# Patient Record
Sex: Female | Born: 1995
Health system: Southern US, Community
[De-identification: ages and names within clinical notes are randomized; demographics above are authoritative.]

## PROBLEM LIST (undated history)

## (undated) DIAGNOSIS — Z789 Other specified health status: Secondary | ICD-10-CM

## (undated) DIAGNOSIS — O139 Gestational [pregnancy-induced] hypertension without significant proteinuria, unspecified trimester: Secondary | ICD-10-CM

## (undated) HISTORY — PX: WISDOM TOOTH EXTRACTION: SHX21

## (undated) HISTORY — PX: NO PAST SURGERIES: SHX2092

---
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
## 2013-07-10 ENCOUNTER — Encounter (HOSPITAL_COMMUNITY): Payer: Self-pay | Admitting: Emergency Medicine

## 2013-07-10 ENCOUNTER — Emergency Department (HOSPITAL_COMMUNITY)
Admission: EM | Admit: 2013-07-10 | Discharge: 2013-07-10 | Disposition: A | Discharge status: Home / Self Care | Payer: Medicaid Other | Attending: Emergency Medicine | Admitting: Emergency Medicine

## 2013-07-10 DIAGNOSIS — R112 Nausea with vomiting, unspecified: Secondary | ICD-10-CM | POA: Insufficient documentation

## 2013-07-10 DIAGNOSIS — R197 Diarrhea, unspecified: Secondary | ICD-10-CM | POA: Insufficient documentation

## 2013-07-10 DIAGNOSIS — K529 Noninfective gastroenteritis and colitis, unspecified: Secondary | ICD-10-CM

## 2013-07-10 DIAGNOSIS — K5289 Other specified noninfective gastroenteritis and colitis: Secondary | ICD-10-CM | POA: Insufficient documentation

## 2013-07-10 DIAGNOSIS — Z79899 Other long term (current) drug therapy: Secondary | ICD-10-CM | POA: Insufficient documentation

## 2013-07-10 DIAGNOSIS — N390 Urinary tract infection, site not specified: Secondary | ICD-10-CM | POA: Insufficient documentation

## 2013-07-10 DIAGNOSIS — Z3202 Encounter for pregnancy test, result negative: Secondary | ICD-10-CM | POA: Insufficient documentation

## 2013-07-10 DIAGNOSIS — R6883 Chills (without fever): Secondary | ICD-10-CM | POA: Insufficient documentation

## 2013-07-10 LAB — URINALYSIS, ROUTINE W REFLEX MICROSCOPIC
Glucose, UA: NEGATIVE mg/dL
Ketones, ur: 40 mg/dL — AB
Protein, ur: NEGATIVE mg/dL
pH: 5 (ref 5.0–8.0)

## 2013-07-10 LAB — COMPREHENSIVE METABOLIC PANEL
ALT: 9 U/L (ref 0–35)
AST: 20 U/L (ref 0–37)
Albumin: 4.6 g/dL (ref 3.5–5.2)
Alkaline Phosphatase: 74 U/L (ref 39–117)
BUN: 12 mg/dL (ref 6–23)
Chloride: 96 mEq/L (ref 96–112)
Potassium: 4 mEq/L (ref 3.5–5.1)
Sodium: 132 mEq/L — ABNORMAL LOW (ref 135–145)
Total Bilirubin: 1 mg/dL (ref 0.3–1.2)
Total Protein: 8.7 g/dL — ABNORMAL HIGH (ref 6.0–8.3)

## 2013-07-10 LAB — CBC WITH DIFFERENTIAL/PLATELET
Basophils Absolute: 0 10*3/uL (ref 0.0–0.1)
Basophils Relative: 0 % (ref 0–1)
Hemoglobin: 16.3 g/dL — ABNORMAL HIGH (ref 12.0–15.0)
MCHC: 35.3 g/dL (ref 30.0–36.0)
Monocytes Relative: 3 % (ref 3–12)
Neutro Abs: 6.1 10*3/uL (ref 1.7–7.7)
Neutrophils Relative %: 88 % — ABNORMAL HIGH (ref 43–77)
Platelets: 237 10*3/uL (ref 150–400)

## 2013-07-10 LAB — POCT PREGNANCY, URINE: Preg Test, Ur: NEGATIVE

## 2013-07-10 LAB — LIPASE, BLOOD: Lipase: 17 U/L (ref 11–59)

## 2013-07-10 MED ORDER — CIPROFLOXACIN HCL 250 MG PO TABS: 250.0000 mg | ORAL_TABLET | Freq: Two times a day (BID) | ORAL | Status: DC

## 2013-07-10 MED ORDER — ONDANSETRON 4 MG PO TBDP: ORAL_TABLET | ORAL | Status: DC

## 2013-07-10 MED ORDER — DICYCLOMINE HCL 20 MG PO TABS: 20.0000 mg | ORAL_TABLET | Freq: Two times a day (BID) | ORAL | Status: DC

## 2013-07-10 MED ORDER — ONDANSETRON HCL 4 MG/2ML IJ SOLN
4.0000 mg | Freq: Once | INTRAMUSCULAR | Status: AC
  Administered 2013-07-10: 4 mg via INTRAVENOUS
  Filled 2013-07-10: qty 2

## 2013-07-10 MED ORDER — SODIUM CHLORIDE 0.9 % IV BOLUS (SEPSIS)
1000.0000 mL | Freq: Once | INTRAVENOUS | Status: AC
  Administered 2013-07-10: 1000 mL via INTRAVENOUS

## 2013-07-10 NOTE — ED Provider Notes (Signed)
CSN: [id number]     Arrival date & time 07/10/13  1846 History     First MD Initiated Contact with Patient 07/10/13 2011     Chief Complaint  Patient presents with  . Abdominal Pain  . Nausea   (Consider location/radiation/quality/duration/timing/severity/associated sxs/prior Treatment) HPI Pt p/w 2 days of abd cramping, vomiting and loose stools. Pt states abd pain is improved after BM or vomiting. No fever. +chills. No blood in vomit or stool. No previous abdominal surgeries. No sick contacts. No urinary or vaginal symptoms. Pt admit to using Ibuprofen and aspirin regularly.  History reviewed. No pertinent past medical history. Past Surgical History  Procedure Laterality Date  . Wisdom tooth extraction     No family history on file. History  Substance Use Topics  . Smoking status: Never Smoker   . Smokeless tobacco: Never Used  . Alcohol Use: No   OB History   Grav Para Term Preterm Abortions TAB SAB Ect Mult Living                 Review of Systems  Constitutional: Positive for chills. Negative for fever and fatigue.  HENT: Negative for neck pain.   Respiratory: Negative for cough and shortness of breath.   Cardiovascular: Negative for chest pain, palpitations and leg swelling.  Gastrointestinal: Positive for nausea, vomiting, abdominal pain and diarrhea. Negative for constipation, blood in stool and abdominal distention.  Genitourinary: Negative for dysuria, frequency, flank pain, vaginal bleeding, vaginal discharge and pelvic pain.  Musculoskeletal: Negative for myalgias and back pain.  Skin: Negative for rash and wound.  Neurological: Negative for dizziness, weakness, light-headedness, numbness and headaches.  All other systems reviewed and are negative.    Allergies  Review of patient's allergies indicates no known allergies.  Home Medications   Current Outpatient Rx  Name  Route  Sig  Dispense  Refill  . medroxyPROGESTERone (DEPO-PROVERA) 150 MG/ML  injection   Intramuscular   Inject 1 mL (150 mg total) into the muscle every 3 (three) months.   1 mL      . ciprofloxacin (CIPRO) 250 MG tablet   Oral   Take 1 tablet (250 mg total) by mouth every 12 (twelve) hours.   10 tablet   0   . dicyclomine (BENTYL) 20 MG tablet   Oral   Take 1 tablet (20 mg total) by mouth 2 (two) times daily.   20 tablet   0   . ondansetron (ZOFRAN ODT) 4 MG disintegrating tablet      4mg  ODT q4 hours prn nausea/vomit   8 tablet   0    BP 137/80  Pulse 93  Temp(Src) 99.7 F (37.6 C) (Oral)  Resp 18  SpO2 100%  LMP 06/09/2013 Physical Exam  Nursing note and vitals reviewed. Constitutional: She is oriented to person, place, and time. She appears well-developed and well-nourished. No distress.  HENT:  Head: Normocephalic and atraumatic.  Mouth/Throat: Oropharynx is clear and moist. No oropharyngeal exudate.  Eyes: EOM are normal. Pupils are equal, round, and reactive to light.  Neck: Normal range of motion. Neck supple.  Cardiovascular: Normal rate and regular rhythm.   Pulmonary/Chest: Effort normal and breath sounds normal. No respiratory distress. She has no wheezes. She has no rales. She exhibits no tenderness.  Abdominal: Soft. Bowel sounds are normal. She exhibits no distension and no mass. There is tenderness (epigastric TTP. No rebound or guarding. ). There is no rebound and no guarding.  Musculoskeletal: Normal range of  motion. She exhibits no edema and no tenderness.  No CVAT  Neurological: She is alert and oriented to person, place, and time.  5/5 motor in all ext, sensation intact.   Skin: Skin is warm and dry. No rash noted. No erythema.  Psychiatric: She has a normal mood and affect. Her behavior is normal.    ED Course   Procedures (including critical care time)  Labs Reviewed  CBC WITH DIFFERENTIAL - Abnormal; Notable for the following:    RBC 5.49 (*)    Hemoglobin 16.3 (*)    HCT 46.2 (*)    Neutrophils Relative %  88 (*)    Lymphocytes Relative 9 (*)    Lymphs Abs 0.6 (*)    All other components within normal limits  COMPREHENSIVE METABOLIC PANEL - Abnormal; Notable for the following:    Sodium 132 (*)    Total Protein 8.7 (*)    GFR calc non Af Amer 89 (*)    All other components within normal limits  URINALYSIS, ROUTINE W REFLEX MICROSCOPIC - Abnormal; Notable for the following:    Color, Urine AMBER (*)    APPearance CLOUDY (*)    Specific Gravity, Urine 1.035 (*)    Hgb urine dipstick TRACE (*)    Bilirubin Urine SMALL (*)    Ketones, ur 40 (*)    Leukocytes, UA MODERATE (*)    All other components within normal limits  LIPASE, BLOOD  URINE MICROSCOPIC-ADD ON  POCT PREGNANCY, URINE   No results found. 1. Gastroenteritis   2. UTI (urinary tract infection)     MDM  Pt with no vomiting in ED. States she feels better. Had one episode of diarrhea in ED. Return precautions given. Abd remain soft.   Loren Racer, MD 07/10/13 [phone]

## 2013-07-10 NOTE — ED Notes (Addendum)
Pt reports 9/10 mid abdominal pain. Pt reports that she has had nausea, vomiting, and diarrhea since yesterday. Pt denies any other family members or friends being sick. Pt is A/Ox4, vital signs are stable, and in no apparent distress.

## 2013-10-08 ENCOUNTER — Encounter (HOSPITAL_COMMUNITY): Payer: Self-pay | Admitting: Emergency Medicine

## 2013-10-08 ENCOUNTER — Emergency Department (HOSPITAL_COMMUNITY)
Admission: EM | Admit: 2013-10-08 | Discharge: 2013-10-08 | Disposition: A | Discharge status: Home / Self Care | Payer: Medicaid Other | Attending: Emergency Medicine | Admitting: Emergency Medicine

## 2013-10-08 DIAGNOSIS — T24219A Burn of second degree of unspecified thigh, initial encounter: Secondary | ICD-10-CM | POA: Insufficient documentation

## 2013-10-08 DIAGNOSIS — T24112A Burn of first degree of left thigh, initial encounter: Secondary | ICD-10-CM

## 2013-10-08 DIAGNOSIS — T24212A Burn of second degree of left thigh, initial encounter: Secondary | ICD-10-CM

## 2013-10-08 DIAGNOSIS — X12XXXA Contact with other hot fluids, initial encounter: Secondary | ICD-10-CM | POA: Insufficient documentation

## 2013-10-08 DIAGNOSIS — Y929 Unspecified place or not applicable: Secondary | ICD-10-CM | POA: Insufficient documentation

## 2013-10-08 DIAGNOSIS — T2620XA Burn with resulting rupture and destruction of unspecified eyeball, initial encounter: Secondary | ICD-10-CM | POA: Insufficient documentation

## 2013-10-08 DIAGNOSIS — Y939 Activity, unspecified: Secondary | ICD-10-CM | POA: Insufficient documentation

## 2013-10-08 MED ORDER — SILVER SULFADIAZINE 1 % EX CREA
TOPICAL_CREAM | Freq: Once | CUTANEOUS | Status: AC
  Administered 2013-10-08: 16:00:00 via TOPICAL
  Filled 2013-10-08: qty 85

## 2013-10-08 MED ORDER — SILVER SULFADIAZINE 1 % EX CREA: 1.0000 "application " | TOPICAL_CREAM | Freq: Every day | CUTANEOUS | Status: DC

## 2013-10-08 MED ORDER — TRAMADOL HCL 50 MG PO TABS: 50.0000 mg | ORAL_TABLET | Freq: Four times a day (QID) | ORAL | Status: DC | PRN
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
## 2013-10-14 ENCOUNTER — Telehealth: Payer: Self-pay | Admitting: Family Medicine

## 2013-10-14 NOTE — Telephone Encounter (Signed)
Phoned pt lmtrc needs follow up er appt

## 2013-10-15 NOTE — ED Provider Notes (Signed)
Medical screening examination/treatment/procedure(s) were performed by non-physician practitioner and as supervising physician I was immediately available for consultation/collaboration.  EKG Interpretation   None        Derwood Kaplan, MD 10/15/13 0321

## 2013-10-15 NOTE — ED Provider Notes (Signed)
Medical screening examination/treatment/procedure(s) were performed by non-physician practitioner and as supervising physician I was immediately available for consultation/collaboration.  EKG Interpretation   None        Derwood Kaplan, MD 10/15/13 [phone]

## 2013-10-27 NOTE — ED Provider Notes (Signed)
Medical screening examination/treatment/procedure(s) were performed by non-physician practitioner and as supervising physician I was immediately available for consultation/collaboration.  EKG Interpretation   None        Carmine Carrozza, MD 10/27/13 2219 

## 2019-06-28 ENCOUNTER — Emergency Department (HOSPITAL_COMMUNITY)
Admission: EM | Admit: 2019-06-28 | Discharge: 2019-06-28 | Disposition: A | Discharge status: Home / Self Care | Payer: Self-pay | Attending: Emergency Medicine | Admitting: Emergency Medicine

## 2019-06-28 ENCOUNTER — Other Ambulatory Visit: Payer: Self-pay

## 2019-06-28 ENCOUNTER — Encounter (HOSPITAL_COMMUNITY): Payer: Self-pay | Admitting: Emergency Medicine

## 2019-06-28 DIAGNOSIS — N390 Urinary tract infection, site not specified: Secondary | ICD-10-CM

## 2019-06-28 DIAGNOSIS — R1084 Generalized abdominal pain: Secondary | ICD-10-CM | POA: Insufficient documentation

## 2019-06-28 DIAGNOSIS — B9689 Other specified bacterial agents as the cause of diseases classified elsewhere: Secondary | ICD-10-CM

## 2019-06-28 DIAGNOSIS — N760 Acute vaginitis: Secondary | ICD-10-CM

## 2019-06-28 LAB — WET PREP, GENITAL
Trich, Wet Prep: NONE SEEN
Yeast Wet Prep HPF POC: NONE SEEN

## 2019-06-28 LAB — URINALYSIS, ROUTINE W REFLEX MICROSCOPIC
Bilirubin Urine: NEGATIVE
Glucose, UA: NEGATIVE mg/dL
Ketones, ur: NEGATIVE mg/dL
Nitrite: NEGATIVE
Protein, ur: 30 mg/dL — AB
Specific Gravity, Urine: 1.014 (ref 1.005–1.030)
pH: 6 (ref 5.0–8.0)

## 2019-06-28 LAB — POC URINE PREG, ED: Preg Test, Ur: NEGATIVE

## 2019-06-28 MED ORDER — CEPHALEXIN 250 MG PO CAPS
500.0000 mg | ORAL_CAPSULE | Freq: Once | ORAL | Status: AC
  Administered 2019-06-28: 500 mg via ORAL
  Filled 2019-06-28: qty 2

## 2019-06-28 MED ORDER — METRONIDAZOLE 500 MG PO TABS
500.0000 mg | ORAL_TABLET | Freq: Once | ORAL | Status: AC
  Administered 2019-06-28: 500 mg via ORAL
  Filled 2019-06-28: qty 1

## 2019-06-28 MED ORDER — CEPHALEXIN 500 MG PO CAPS: 500.0000 mg | ORAL_CAPSULE | Freq: Three times a day (TID) | ORAL | 0 refills | Status: DC

## 2019-06-28 MED ORDER — IBUPROFEN 800 MG PO TABS
800.0000 mg | ORAL_TABLET | Freq: Once | ORAL | Status: AC
  Administered 2019-06-28: 800 mg via ORAL
  Filled 2019-06-28: qty 1

## 2019-06-28 MED ORDER — METRONIDAZOLE 500 MG PO TABS: 500.0000 mg | ORAL_TABLET | Freq: Two times a day (BID) | ORAL | 0 refills | Status: DC

## 2019-06-28 NOTE — ED Notes (Signed)
Discharge instructions discussed with pt. Pt. verbalized understanding. No questions at this time 

## 2019-06-28 NOTE — ED Provider Notes (Signed)
TIME SEEN: 5:19 AM  CHIEF COMPLAINT: Abdominal pain  HPI: Patient is a 23 year old female with no significant past medical history who presents to the emergency department with lower abdominal pain that she describes as a cramping for the past 2 weeks.  Last menstrual period was July 9.  She is sexually active with one female partner, her husband.  States she has had chlamydia and gonorrhea in the past that have been treated.  No previous pregnancies.  She denies fevers, nausea, vomiting, vaginal discharge or bleeding.  Has had some dysuria.  States she was concerned this could be an STD or UTI.  ROS: See HPI Constitutional: no fever  Eyes: no drainage  ENT: no runny nose   Cardiovascular:  no chest pain  Resp: no SOB  GI: no vomiting GU:  dysuria Integumentary: no rash  Allergy: no hives  Musculoskeletal: no leg swelling  Neurological: no slurred speech ROS otherwise negative  PAST MEDICAL HISTORY/PAST SURGICAL HISTORY:  History reviewed. No pertinent past medical history.  MEDICATIONS:  Prior to Admission medications   Medication Sig Start Date End Date Taking? Authorizing Provider  diphenhydrAMINE (BENADRYL) 25 mg capsule Take 25 mg by mouth every 6 (six) hours as needed for allergies.    [provider]  medroxyPROGESTERone (DEPO-PROVERA) 150 MG/ML injection Inject 1 mL (150 mg total) into the muscle every 3 (three) months. 09/09/11   Denita Lung, MD  silver sulfADIAZINE (SILVADENE) 1 % cream Apply 1 application topically daily. 10/08/13   Kirichenko, Lahoma Rocker, PA-C  traMADol (ULTRAM) 50 MG tablet Take 1 tablet (50 mg total) by mouth every 6 (six) hours as needed. 10/08/13   Kirichenko, Lahoma Rocker, PA-C    ALLERGIES:  No Known Allergies  SOCIAL HISTORY:  Social History   Tobacco Use  . Smoking status: Never Smoker  . Smokeless tobacco: Never Used  Substance Use Topics  . Alcohol use: No    FAMILY HISTORY: History reviewed. No pertinent family  history.  EXAM: BP 116/74   Pulse 86   Temp 97.8 F (36.6 C) (Oral)   Resp 19   Ht 5\' 2"  (1.575 m)   Wt 54.4 kg   LMP 06/13/2019   SpO2 99%   BMI 21.95 kg/m  CONSTITUTIONAL: Alert and oriented and responds appropriately to questions. Well-appearing; well-nourished HEAD: Normocephalic EYES: Conjunctivae clear, pupils appear equal, EOMI ENT: normal nose; moist mucous membranes NECK: Supple, no meningismus, no nuchal rigidity, no LAD  CARD: RRR; S1 and S2 appreciated; no murmurs, no clicks, no rubs, no gallops RESP: Normal chest excursion without splinting or tachypnea; breath sounds clear and equal bilaterally; no wheezes, no rhonchi, no rales, no hypoxia or respiratory distress, speaking full sentences ABD/GI: Normal bowel sounds; non-distended; soft, non-tender, no rebound, no guarding, no peritoneal signs, no hepatosplenomegaly GU:  Normal external genitalia. No lesions, rashes noted. Patient has no vaginal bleeding on exam.  Minimal thick white vaginal discharge.  No adnexal tenderness, mass or fullness, no cervical motion tenderness. Cervix is not appear friable.  Cervix is closed.  Chaperone present for exam.  Patient has some suprapubic tenderness on bimanual exam. BACK:  The back appears normal and is non-tender to palpation, there is no CVA tenderness EXT: Normal ROM in all joints; non-tender to palpation; no edema; normal capillary refill; no cyanosis, no calf tenderness or swelling    SKIN: Normal color for age and race; warm; no rash NEURO: Moves all extremities equally PSYCH: The patient's mood and manner are appropriate. Grooming and  personal hygiene are appropriate.  MEDICAL DECISION MAKING: Patient here with complaints of lower abdominal pain.  Her pelvic exam is unremarkable other than some suprapubic tenderness on bimanual exam.  No tenderness at McBurney's point.  No bleeding or significant discharge.  Will obtain urinalysis, urine pregnancy test and pelvic cultures.   Low suspicion for torsion, TOA, PID based on exam.  Doubt appendicitis.  Will give ibuprofen for symptomatic relief.  Doubt pyelonephritis or kidney stone.  She is well-appearing without flank pain, vomiting.  ED PROGRESS: Patient's urine appears infected.  She has large leukocyte esterase, 6-10 red blood cells, 11-20 white blood cells and rare bacteria but only 0-5 squamous cells.  Urine culture pending.  Will discharge on Keflex.  Her wet prep is also positive for clue cells.  Given she does have some discharge on exam, will discharge with Flagyl as well.  Discussed things findings with patient.  Her pregnancy test is negative.  Recommended Tylenol, Motrin and Azo over-the-counter for pain.  She verbalized understanding.  She has a PCP for follow-up as needed.  At this time, I do not feel there is any life-threatening condition present. I have reviewed and discussed all results (EKG, imaging, lab, urine as appropriate) and exam findings with patient/family. I have reviewed nursing notes and appropriate previous records.  I feel the patient is safe to be discharged home without further emergent workup and can continue workup as an outpatient as needed. Discussed usual and customary return precautions. Patient/family verbalize understanding and are comfortable with this plan.  Outpatient follow-up has been provided as needed. All questions have been answered.      Correen Bubolz, Layla MawKristen N, DO 06/28/19 [phone]

## 2019-06-28 NOTE — ED Triage Notes (Signed)
Pt from home c/o abdominal cramping of lower abdomen. States pain 7 out of 10. Starting last week. Cramping consistent. Pt denies NVD.

## 2019-06-28 NOTE — Discharge Instructions (Addendum)
You may alternate Tylenol 1000 mg every 6 hours as needed for pain and Ibuprofen 800 mg every 8 hours as needed for pain.  Please take Ibuprofen with food. ° °

## 2019-06-29 LAB — GC/CHLAMYDIA PROBE AMP (~~LOC~~) NOT AT ARMC
Chlamydia: POSITIVE — AB
Neisseria Gonorrhea: NEGATIVE

## 2019-06-29 LAB — URINE CULTURE: Culture: 80000 — AB

## 2019-06-30 ENCOUNTER — Telehealth: Payer: Self-pay | Admitting: *Deleted

## 2019-06-30 NOTE — Telephone Encounter (Signed)
Post ED Visit - Positive Culture Follow-up  Culture report reviewed by antimicrobial stewardship pharmacist: [place] Team []  Elenor Quinones, Pharm.D. []  Heide Guile, Pharm.D., BCPS AQ-ID []  Parks Neptune, Pharm.D., BCPS []  Alycia Rossetti, Pharm.D., BCPS []  [place], Pharm.D., BCPS, AAHIVP []  Legrand Como, Pharm.D., BCPS, AAHIVP [x]  Salome Arnt, PharmD, BCPS []  Johnnette Gourd, PharmD, BCPS []  Hughes Better, PharmD, BCPS []  Leeroy Cha, PharmD []  Laqueta Linden, PharmD, BCPS []  Albertina Parr, PharmD  [place] Team []  Leodis Sias, PharmD []  Lindell Spar, PharmD []  Royetta Asal, PharmD []  Graylin Shiver, Rph []  Rema Fendt) Glennon Mac, PharmD []  Arlyn Dunning, PharmD []  Netta Cedars, PharmD []  Dia Sitter, PharmD []  Leone Haven, PharmD []  Gretta Arab, PharmD []  Theodis Shove, PharmD []  Peggyann Juba, PharmD []  Reuel Boom, PharmD   Positive urine culture Treated with Cephalexin, organism sensitive to the same and no further patient follow-up is required at this time.  Harlon Flor [facility] 06/30/2019, 2:53 PM

## 2019-07-03 ENCOUNTER — Telehealth: Payer: Self-pay | Admitting: Student

## 2019-07-03 DIAGNOSIS — A749 Chlamydial infection, unspecified: Secondary | ICD-10-CM

## 2019-07-03 MED ORDER — AZITHROMYCIN 500 MG PO TABS: 1000.0000 mg | ORAL_TABLET | Freq: Once | ORAL | 0 refills | Status: AC

## 2019-07-03 NOTE — Telephone Encounter (Addendum)
Darice D Bezold tested positive for  Chlamydia. Patient was called by RN and allergies and pharmacy confirmed. Rx sent to pharmacy of choice.   Jorje Guild, NP 07/03/2019 10:12 AM       ----- Message from Bjorn Loser, RN sent at 07/02/2019  2:39 PM EDT ----- This patient tested positive for :  Chlamydia  She:"has NKDA", I have informed the patient of her results and confirmed her pharmacy is correct in her chart. Please send Rx.   Thank you,   Bjorn Loser, RN   Results faxed to [facility].

## 2020-02-10 ENCOUNTER — Encounter (HOSPITAL_COMMUNITY): Payer: Self-pay | Admitting: *Deleted

## 2020-02-10 ENCOUNTER — Ambulatory Visit (HOSPITAL_COMMUNITY)
Admission: EM | Admit: 2020-02-10 | Discharge: 2020-02-10 | Disposition: A | Discharge status: Home / Self Care | Payer: Medicaid Other | Attending: Family Medicine | Admitting: Family Medicine

## 2020-02-10 ENCOUNTER — Other Ambulatory Visit: Payer: Self-pay

## 2020-02-10 DIAGNOSIS — R350 Frequency of micturition: Secondary | ICD-10-CM | POA: Insufficient documentation

## 2020-02-10 DIAGNOSIS — Z3201 Encounter for pregnancy test, result positive: Secondary | ICD-10-CM | POA: Insufficient documentation

## 2020-02-10 DIAGNOSIS — R109 Unspecified abdominal pain: Secondary | ICD-10-CM

## 2020-02-10 LAB — POCT URINALYSIS DIP (DEVICE)
Bilirubin Urine: NEGATIVE
Glucose, UA: NEGATIVE mg/dL
Ketones, ur: NEGATIVE mg/dL
Nitrite: NEGATIVE
Protein, ur: 100 mg/dL — AB
Specific Gravity, Urine: 1.025 (ref 1.005–1.030)
Urobilinogen, UA: 0.2 mg/dL (ref 0.0–1.0)
pH: 5.5 (ref 5.0–8.0)

## 2020-02-10 LAB — POC URINE PREG, ED: Preg Test, Ur: POSITIVE — AB

## 2020-02-10 LAB — POCT PREGNANCY, URINE: Preg Test, Ur: POSITIVE — AB

## 2020-02-10 MED ORDER — CEPHALEXIN 500 MG PO CAPS: 500.0000 mg | ORAL_CAPSULE | Freq: Four times a day (QID) | ORAL | 0 refills | Status: DC

## 2020-02-10 NOTE — Discharge Instructions (Signed)
Your pregnancy test was positive. We are treating you for urinary tract infection. If your abdominal pain worsens despite this treatment you will need to go to the ER for ultrasound. Given you a contact for primary care, OB/GYN follow-up.

## 2020-02-10 NOTE — ED Triage Notes (Signed)
Patient reports lower abdominal pain since March 1st, described as cramping and sharp, pain is intermittent, pain has been constant no progression of severity. BM hx normal. Patient reports polyuria. Denies flank pain.

## 2020-02-10 NOTE — ED Provider Notes (Signed)
[place]    CSN: [id number] Arrival date & time: 02/10/20  5462      History   Chief Complaint Chief Complaint  Patient presents with  . Abdominal Pain    HPI Amanda Mcdonald is a 24 y.o. female.   Patient is a 24 year old female presents today with lower abdominal cramping that has been intermittent since 02/03/2020.  Described as cramping and sharp at times.  It is not gotten worse.  She is also had frequent urination.  Denies any fever, nausea, vomiting.  Reporting regular bowel movements and no diarrhea.  Last menstrual period was 12/24/2019.  There is some concern for pregnancy.  Denies any vaginal discharge, bleeding or irritation.  ROS per HPI      History reviewed. No pertinent past medical history.  There are no problems to display for this patient.   Past Surgical History:  Procedure Laterality Date  . WISDOM TOOTH EXTRACTION      OB History   No obstetric history on file.      Home Medications    Prior to Admission medications   Medication Sig Start Date End Date Taking? Authorizing Provider  cephALEXin (KEFLEX) 500 MG capsule Take 1 capsule (500 mg total) by mouth 4 (four) times daily. 02/10/20   Orvan July, NP    Family History Family History  Problem Relation Age of Onset  . Healthy Mother   . Healthy Father     Social History Social History   Tobacco Use  . Smoking status: Never Smoker  . Smokeless tobacco: Never Used  Substance Use Topics  . Alcohol use: No  . Drug use: No     Allergies   Patient has no known allergies.   Review of Systems Review of Systems   Physical Exam Triage Vital Signs ED Triage Vitals  Enc Vitals Group     BP 02/10/20 0924 124/86     Pulse Rate 02/10/20 0924 80     Resp 02/10/20 0924 16     Temp 02/10/20 0924 98.9 F (37.2 C)     Temp Source 02/10/20 0924 Oral     SpO2 02/10/20 0924 99 %     Weight --      Height --      Head Circumference --      Peak Flow --      Pain Score  02/10/20 0922 6     Pain Loc --      Pain Edu? --      Excl. in [place]? --    No data found.  Updated Vital Signs BP 124/86 (BP Location: Left Arm)   Pulse 80   Temp 98.9 F (37.2 C) (Oral)   Resp 16   LMP 12/24/2019   SpO2 99%   Visual Acuity Right Eye Distance:   Left Eye Distance:   Bilateral Distance:    Right Eye Near:   Left Eye Near:    Bilateral Near:     Physical Exam Vitals and nursing note reviewed.  Constitutional:      General: She is not in acute distress.    Appearance: Normal appearance. She is not ill-appearing, toxic-appearing or diaphoretic.  HENT:     Head: Normocephalic.     Nose: Nose normal.  Eyes:     Conjunctiva/sclera: Conjunctivae normal.  Pulmonary:     Effort: Pulmonary effort is normal.  Abdominal:     Palpations: Abdomen is soft.     Tenderness: There is  no abdominal tenderness.  Musculoskeletal:        General: Normal range of motion.     Cervical back: Normal range of motion.  Skin:    General: Skin is warm and dry.     Findings: No rash.  Neurological:     Mental Status: She is alert.  Psychiatric:        Mood and Affect: Mood normal.      UC Treatments / Results  Labs (all labs ordered are listed, but only abnormal results are displayed) Labs Reviewed  POCT URINALYSIS DIP (DEVICE) - Abnormal; Notable for the following components:      Result Value   Hgb urine dipstick LARGE (*)    Protein, ur 100 (*)    Leukocytes,Ua SMALL (*)    All other components within normal limits  POCT PREGNANCY, URINE - Abnormal; Notable for the following components:   Preg Test, Ur POSITIVE (*)    All other components within normal limits  POC URINE PREG, ED - Abnormal; Notable for the following components:   Preg Test, Ur POSITIVE (*)    All other components within normal limits  URINE CULTURE    EKG   Radiology No results found.  Procedures Procedures (including critical care time)  Medications Ordered in UC Medications - No  data to display  Initial Impression / Assessment and Plan / UC Course  I have reviewed the triage vital signs and the nursing notes.  Pertinent labs & imaging results that were available during my care of the patient were reviewed by me and considered in my medical decision making (see chart for details).     Pregnancy test positive-contact given for OB/GYN follow-up.  Recommended starting prenatal vitamins.  Frequent urination-urine with small leuks and large hemoglobin. Most likely patient's frequent urination and abdominal discomfort is coming from a urinary tract infection. Will treat with antibiotics and have her push fluids. Recommended if the pain continues she need to follow-up with OB/GYN or [facility]. Patient understanding and agree. Final Clinical Impressions(s) / UC Diagnoses   Final diagnoses:  Positive pregnancy test  Frequent urination     Discharge Instructions     Your pregnancy test was positive. We are treating you for urinary tract infection. If your abdominal pain worsens despite this treatment you will need to go to the ER for ultrasound. Given you a contact for primary care, OB/GYN follow-up.    ED Prescriptions    Medication Sig Dispense Auth. Provider   cephALEXin (KEFLEX) 500 MG capsule Take 1 capsule (500 mg total) by mouth 4 (four) times daily. 28 capsule Jann Ra A, NP     PDMP not reviewed this encounter.   Janace Aris, NP 02/10/20 1226

## 2020-02-12 LAB — URINE CULTURE: Culture: >=100000 — AB

## 2020-03-23 LAB — OB RESULTS CONSOLE GC/CHLAMYDIA
Chlamydia: NEGATIVE
Gonorrhea: NEGATIVE

## 2020-03-23 LAB — OB RESULTS CONSOLE ABO/RH: RH Type: POSITIVE

## 2020-03-23 LAB — OB RESULTS CONSOLE HEPATITIS B SURFACE ANTIGEN: Hepatitis B Surface Ag: NEGATIVE

## 2020-03-23 LAB — OB RESULTS CONSOLE HIV ANTIBODY (ROUTINE TESTING): HIV: NONREACTIVE

## 2020-03-23 LAB — OB RESULTS CONSOLE RPR: RPR: NONREACTIVE

## 2020-03-23 LAB — OB RESULTS CONSOLE RUBELLA ANTIBODY, IGM: Rubella: IMMUNE

## 2020-03-26 ENCOUNTER — Inpatient Hospital Stay (HOSPITAL_COMMUNITY): Payer: Medicaid Other

## 2020-03-26 ENCOUNTER — Encounter (HOSPITAL_COMMUNITY): Payer: Self-pay | Admitting: Family Medicine

## 2020-03-26 ENCOUNTER — Inpatient Hospital Stay (HOSPITAL_COMMUNITY)
Admission: AD | Admit: 2020-03-26 | Discharge: 2020-03-26 | Disposition: A | Discharge status: Home / Self Care | Payer: Medicaid Other | Attending: Family Medicine | Admitting: Family Medicine

## 2020-03-26 ENCOUNTER — Other Ambulatory Visit: Payer: Self-pay

## 2020-03-26 DIAGNOSIS — Z3490 Encounter for supervision of normal pregnancy, unspecified, unspecified trimester: Secondary | ICD-10-CM

## 2020-03-26 DIAGNOSIS — O210 Mild hyperemesis gravidarum: Secondary | ICD-10-CM | POA: Insufficient documentation

## 2020-03-26 DIAGNOSIS — Z3A11 11 weeks gestation of pregnancy: Secondary | ICD-10-CM

## 2020-03-26 DIAGNOSIS — O219 Vomiting of pregnancy, unspecified: Secondary | ICD-10-CM

## 2020-03-26 HISTORY — DX: Other specified health status: Z78.9

## 2020-03-26 LAB — URINALYSIS, ROUTINE W REFLEX MICROSCOPIC
Bilirubin Urine: NEGATIVE
Glucose, UA: NEGATIVE mg/dL
Hgb urine dipstick: NEGATIVE
Ketones, ur: NEGATIVE mg/dL
Leukocytes,Ua: NEGATIVE
Nitrite: NEGATIVE
Protein, ur: NEGATIVE mg/dL
Specific Gravity, Urine: 1.023 (ref 1.005–1.030)
pH: 6 (ref 5.0–8.0)

## 2020-03-26 LAB — CBC WITH DIFFERENTIAL/PLATELET
Abs Immature Granulocytes: 0.02 10*3/uL (ref 0.00–0.07)
Basophils Absolute: 0 10*3/uL (ref 0.0–0.1)
Basophils Relative: 0 %
Eosinophils Absolute: 0 10*3/uL (ref 0.0–0.5)
Eosinophils Relative: 0 %
HCT: 34 % — ABNORMAL LOW (ref 36.0–46.0)
Hemoglobin: 11.7 g/dL — ABNORMAL LOW (ref 12.0–15.0)
Immature Granulocytes: 0 %
Lymphocytes Relative: 25 %
Lymphs Abs: 1.3 10*3/uL (ref 0.7–4.0)
MCH: 30.2 pg (ref 26.0–34.0)
MCHC: 34.4 g/dL (ref 30.0–36.0)
MCV: 87.9 fL (ref 80.0–100.0)
Monocytes Absolute: 0.4 10*3/uL (ref 0.1–1.0)
Monocytes Relative: 7 %
Neutro Abs: 3.6 10*3/uL (ref 1.7–7.7)
Neutrophils Relative %: 68 %
Platelets: 157 10*3/uL (ref 150–400)
RBC: 3.87 MIL/uL (ref 3.87–5.11)
RDW: 12.3 % (ref 11.5–15.5)
WBC: 5.3 10*3/uL (ref 4.0–10.5)
nRBC: 0 % (ref 0.0–0.2)

## 2020-03-26 LAB — COMPREHENSIVE METABOLIC PANEL
ALT: 10 U/L (ref 0–44)
AST: 14 U/L — ABNORMAL LOW (ref 15–41)
Albumin: 3.3 g/dL — ABNORMAL LOW (ref 3.5–5.0)
Alkaline Phosphatase: 32 U/L — ABNORMAL LOW (ref 38–126)
Anion gap: 9 (ref 5–15)
BUN: <5 mg/dL — ABNORMAL LOW (ref 6–20)
CO2: 23 mmol/L (ref 22–32)
Calcium: 9 mg/dL (ref 8.9–10.3)
Chloride: 101 mmol/L (ref 98–111)
Creatinine, Ser: 0.75 mg/dL (ref 0.44–1.00)
GFR calc Af Amer: >60 mL/min (ref >60)
GFR calc non Af Amer: >60 mL/min (ref >60)
Glucose, Bld: 93 mg/dL (ref 70–99)
Potassium: 3.7 mmol/L (ref 3.5–5.1)
Sodium: 133 mmol/L — ABNORMAL LOW (ref 135–145)
Total Bilirubin: 0.7 mg/dL (ref 0.3–1.2)
Total Protein: 6.4 g/dL — ABNORMAL LOW (ref 6.5–8.1)

## 2020-03-26 MED ORDER — FAMOTIDINE 20 MG PO TABS: 20.0000 mg | ORAL_TABLET | Freq: Every day | ORAL | 0 refills | Status: DC

## 2020-03-26 MED ORDER — LACTATED RINGERS IV BOLUS
1000.0000 mL | Freq: Once | INTRAVENOUS | Status: AC
  Administered 2020-03-26: 1000 mL via INTRAVENOUS

## 2020-03-26 MED ORDER — ONDANSETRON 8 MG PO TBDP: 8.0000 mg | ORAL_TABLET | Freq: Three times a day (TID) | ORAL | 1 refills | Status: AC | PRN

## 2020-03-26 MED ORDER — FAMOTIDINE IN NACL 20-0.9 MG/50ML-% IV SOLN
20.0000 mg | Freq: Once | INTRAVENOUS | Status: AC
  Administered 2020-03-26: 20 mg via INTRAVENOUS
  Filled 2020-03-26: qty 50

## 2020-03-26 MED ORDER — SODIUM CHLORIDE 0.9 % IV SOLN
8.0000 mg | Freq: Once | INTRAVENOUS | Status: AC
  Administered 2020-03-26: 8 mg via INTRAVENOUS
  Filled 2020-03-26: qty 4

## 2020-03-26 NOTE — MAU Note (Signed)
Pt c/o of nausea and vomiting throughout pregnancy which got worse last night. Denies LOF or VB, vaginal discharge or cramping.

## 2020-03-26 NOTE — Discharge Instructions (Signed)
Safe Medications in Pregnancy    Acne: Benzoyl Peroxide Salicylic Acid  Backache/Headache: Tylenol: 2 regular strength every 4 hours OR              2 Extra strength every 6 hours  Colds/Coughs/Allergies: Benadryl (alcohol free) 25 mg every 6 hours as needed Breath right strips Claritin Cepacol throat lozenges Chloraseptic throat spray Cold-Eeze- up to three times per day Cough drops, alcohol free Flonase (by prescription only) Guaifenesin Mucinex Robitussin DM (plain only, alcohol free) Saline nasal spray/drops Sudafed (pseudoephedrine) & Actifed ** use only after [redacted] weeks gestation and if you do not have high blood pressure Tylenol Vicks Vaporub Zinc lozenges Zyrtec   Constipation: Colace Ducolax suppositories Fleet enema Glycerin suppositories Metamucil Milk of magnesia Miralax Senokot Smooth move tea  Diarrhea: Kaopectate Imodium A-D  *NO pepto Bismol  Hemorrhoids: Anusol Anusol HC Preparation H Tucks  Indigestion: Tums Maalox Mylanta Zantac  Pepcid  Insomnia: Benadryl (alcohol free) 25mg  every 6 hours as needed Tylenol PM Unisom, no Gelcaps  Leg Cramps: Tums MagGel  Nausea/Vomiting:  Bonine Dramamine Emetrol Ginger extract Sea bands Meclizine  Nausea medication to take during pregnancy:  Unisom (doxylamine succinate 25 mg tablets) Take one tablet daily at bedtime. If symptoms are not adequately controlled, the dose can be increased to a maximum recommended dose of two tablets daily (1/2 tablet in the morning, 1/2 tablet mid-afternoon and one at bedtime). Vitamin B6 100mg  tablets. Take one tablet twice a day (up to 200 mg per day).  Skin Rashes: Aveeno products Benadryl cream or 25mg  every 6 hours as needed Calamine Lotion 1% cortisone cream  Yeast infection: Gyne-lotrimin 7 Monistat 7   **If taking multiple medications, please check labels to avoid duplicating the same active ingredients **take  medication as directed on the label ** Do not exceed 4000 mg of tylenol in 24 hours **Do not take medications that contain aspirin or ibuprofen       Morning Sickness  Morning sickness is when a woman feels nauseous during pregnancy. This nauseous feeling may or may not come with vomiting. It often occurs in the morning, but it can be a problem at any time of day. Morning sickness is most common during the first trimester. In some cases, it may continue throughout pregnancy. Although morning sickness is unpleasant, it is usually harmless unless the woman develops severe and continual vomiting (hyperemesis gravidarum), a condition that requires more intense treatment. What are the causes? The exact cause of this condition is not known, but it seems to be related to normal hormonal changes that occur in pregnancy. What increases the risk? You are more likely to develop this condition if:  You experienced nausea or vomiting before your pregnancy.  You had morning sickness during a previous pregnancy.  You are pregnant with more than one baby, such as twins. What are the signs or symptoms? Symptoms of this condition include:  Nausea.  Vomiting. How is this diagnosed? This condition is usually diagnosed based on your signs and symptoms. How is this treated? In many cases, treatment is not needed for this condition. Making some changes to what you eat may help to control symptoms. Your health care provider may also prescribe or recommend:  Vitamin B6 supplements.  Anti-nausea medicines.  Ginger. Follow these instructions at home: Medicines  Take over-the-counter and prescription medicines only as told by your health care provider. Do not use any prescription, over-the-counter, or herbal medicines for morning sickness without first talking with your health  care provider.  Taking multivitamins before getting pregnant can prevent or decrease the severity of morning sickness in most  women. Eating and drinking  Eat a piece of dry toast or crackers before getting out of bed in the morning.  Eat 5 or 6 small meals a day.  Eat dry and bland foods, such as rice or a baked potato. Foods that are high in carbohydrates are often helpful.  Avoid greasy, fatty, and spicy foods.  Have someone cook for you if the smell of any food causes nausea and vomiting.  If you feel nauseous after taking prenatal vitamins, take the vitamins at night or with a snack.  Snack on protein foods between meals if you are hungry. Nuts, yogurt, and cheese are good options.  Drink fluids throughout the day.  Try ginger ale made with real ginger, ginger tea made from fresh grated ginger, or ginger candies. General instructions  Do not use any products that contain nicotine or tobacco, such as cigarettes and e-cigarettes. If you need help quitting, ask your health care provider.  Get an air purifier to keep the air in your house free of odors.  Get plenty of fresh air.  Try to avoid odors that trigger your nausea.  Consider trying these methods to help relieve symptoms: ? Wearing an acupressure wristband. These wristbands are often worn for seasickness. ? Acupuncture. Contact a health care provider if:  Your home remedies are not working and you need medicine.  You feel dizzy or light-headed.  You are losing weight. Get help right away if:  You have persistent and uncontrolled nausea and vomiting.  You faint.  You have severe pain in your abdomen. Summary  Morning sickness is when a woman feels nauseous during pregnancy. This nauseous feeling may or may not come with vomiting.  Morning sickness is most common during the first trimester.  It often occurs in the morning, but it can be a problem at any time of day.  In many cases, treatment is not needed for this condition. Making some changes to what you eat may help to control symptoms. This information is not intended to  replace advice given to you by your health care provider. Make sure you discuss any questions you have with your health care provider. Document Revised: 11/03/2017 Document Reviewed: 12/24/2016 Elsevier Patient Education  2020 [place].        Hyperemesis Gravidarum Hyperemesis gravidarum is a severe form of nausea and vomiting that happens during pregnancy. Hyperemesis is worse than morning sickness. It may cause you to have nausea or vomiting all day for many days. It may keep you from eating and drinking enough food and liquids, which can lead to dehydration, malnutrition, and weight loss. Hyperemesis usually occurs during the first half (the first 20 weeks) of pregnancy. It often goes away once a woman is in her second half of pregnancy. However, sometimes hyperemesis continues through an entire pregnancy. What are the causes? The cause of this condition is not known. It may be related to changes in chemicals (hormones) in the body during pregnancy, such as the high level of pregnancy hormone (human chorionic gonadotropin) or the increase in the female sex hormone (estrogen). What are the signs or symptoms? Symptoms of this condition include:  Nausea that does not go away.  Vomiting that does not allow you to keep any food down.  Weight loss.  Body fluid loss (dehydration).  Having no desire to eat, or not liking food that you  have previously enjoyed. How is this diagnosed? This condition may be diagnosed based on:  A physical exam.  Your medical history.  Your symptoms.  Blood tests.  Urine tests. How is this treated? This condition is managed by controlling symptoms. This may include:  Following an eating plan. This can help lessen nausea and vomiting.  Taking prescription medicines. An eating plan and medicines are often used together to help control symptoms. If medicines do not help relieve nausea and vomiting, you may need to receive fluids through an IV at  the hospital. Follow these instructions at home: Eating and drinking   Avoid the following: ? Drinking fluids with meals. Try not to drink anything during the 30 minutes before and after your meals. ? Drinking more than 1 cup of fluid at a time. ? Eating foods that trigger your symptoms. These may include spicy foods, coffee, high-fat foods, very sweet foods, and acidic foods. ? Skipping meals. Nausea can be more intense on an empty stomach. If you cannot tolerate food, do not force it. Try sucking on ice chips or other frozen items and make up for missed calories later. ? Lying down within 2 hours after eating. ? Being exposed to environmental triggers. These may include food smells, smoky rooms, closed spaces, rooms with strong smells, warm or humid places, overly loud and noisy rooms, and rooms with motion or flickering lights. Try eating meals in a well-ventilated area that is free of strong smells. ? Quick and sudden changes in your movement. ? Taking iron pills and multivitamins that contain iron. If you take prescription iron pills, do not stop taking them unless your health care provider approves. ? Preparing food. The smell of food can spoil your appetite or trigger nausea.  To help relieve your symptoms: ? Listen to your body. Everyone is different and has different preferences. Find what works best for you. ? Eat and drink slowly. ? Eat 5-6 small meals daily instead of 3 large meals. Eating small meals and snacks can help you avoid an empty stomach. ? In the morning, before getting out of bed, eat a couple of crackers to avoid moving around on an empty stomach. ? Try eating starchy foods as these are usually tolerated well. Examples include cereal, toast, bread, potatoes, pasta, rice, and pretzels. ? Include at least 1 serving of protein with your meals and snacks. Protein options include lean meats, poultry, seafood, beans, nuts, nut butters, eggs, cheese, and yogurt. ? Try eating  a protein-rich snack before bed. Examples of a protein-rick snack include cheese and crackers or a peanut butter sandwich made with 1 slice of whole-wheat bread and 1 tsp (5 g) of peanut butter. ? Eat or suck on things that have ginger in them. It may help relieve nausea. Add  tsp ground ginger to hot tea or choose ginger tea. ? Try drinking 100% fruit juice or an electrolyte drink. An electrolyte drink contains sodium, potassium, and chloride. ? Drink fluids that are cold, clear, and carbonated or sour. Examples include lemonade, ginger ale, lemon-lime soda, ice water, and sparkling water. ? Brush your teeth or use a mouth rinse after meals. ? Talk with your health care provider about starting a supplement of vitamin B6. General instructions  Take over-the-counter and prescription medicines only as told by your health care provider.  Follow instructions from your health care provider about eating or drinking restrictions.  Continue to take your prenatal vitamins as told by your health care provider. If  you are having trouble taking your prenatal vitamins, talk with your health care provider about different options.  Keep all follow-up and pre-birth (prenatal) visits as told by your health care provider. This is important. Contact a health care provider if:  You have pain in your abdomen.  You have a severe headache.  You have vision problems.  You are losing weight.  You feel weak or dizzy. Get help right away if:  You cannot drink fluids without vomiting.  You vomit blood.  You have constant nausea and vomiting.  You are very weak.  You faint.  You have a fever and your symptoms suddenly get worse. Summary  Hyperemesis gravidarum is a severe form of nausea and vomiting that happens during pregnancy.  Making some changes to your eating habits may help relieve nausea and vomiting.  This condition may be managed with medicine.  If medicines do not help relieve nausea  and vomiting, you may need to receive fluids through an IV at the hospital. This information is not intended to replace advice given to you by your health care provider. Make sure you discuss any questions you have with your health care provider. Document Revised: 12/11/2017 Document Reviewed: 07/20/2016 Elsevier Patient Education  2020 [employer].

## 2020-03-26 NOTE — MAU Provider Note (Signed)
History     CSN: [id number]  Arrival date and time: 03/26/20 1157   First Provider Initiated Contact with Patient 03/26/20 1257      Chief Complaint  Patient presents with  . Emesis  . Nausea  . Dizziness   [name] is a 24 y.o. G1P0 at [redacted]w[redacted]d who presents to MAU for nausea and vomiting.  Onset: 6-7 weeks, worsening last night Location: stomach Duration: 6-7 weeks Character: difficulty with desire to eat, can eat soup, pasta, water, constant nausea, vomiting x4-5 per day Aggravating/Associated: none/none Relieving: left-sided lying Treatment: none  Pt denies VB, vaginal discharge/odor/itching. Pt denies abdominal pain, constipation, diarrhea, or urinary problems. Pt denies fever, chills, fatigue, sweating or changes in appetite. Pt denies SOB or chest pain. Pt denies dizziness, HA, light-headedness, weakness.  Problems this pregnancy include: none. Allergies? NKDA Current medications/supplements? PNVs Prenatal care provider? [facility], next appt 04/14/2020   OB History    Gravida  1   Para      Term      Preterm      AB      Living        SAB      TAB      Ectopic      Multiple      Live Births              Past Medical History:  Diagnosis Date  . Medical history non-contributory     Past Surgical History:  Procedure Laterality Date  . NO PAST SURGERIES    . WISDOM TOOTH EXTRACTION      Family History  Problem Relation Age of Onset  . Healthy Mother   . Healthy Father     Social History   Tobacco Use  . Smoking status: Never Smoker  . Smokeless tobacco: Never Used  Substance Use Topics  . Alcohol use: No  . Drug use: No    Allergies: No Known Allergies  Medications Prior to Admission  Medication Sig Dispense Refill Last Dose  . cephALEXin (KEFLEX) 500 MG capsule Take 1 capsule (500 mg total) by mouth 4 (four) times daily. 28 capsule 0 Past Month at Unknown time    Review of Systems  Constitutional: Negative for  chills, diaphoresis, fatigue and fever.  Eyes: Negative for visual disturbance.  Respiratory: Negative for shortness of breath.   Cardiovascular: Negative for chest pain.  Gastrointestinal: Positive for nausea and vomiting. Negative for abdominal pain, constipation and diarrhea.  Genitourinary: Negative for dysuria, flank pain, frequency, pelvic pain, urgency, vaginal bleeding and vaginal discharge.  Neurological: Negative for dizziness, weakness, light-headedness and headaches.   Physical Exam   Blood pressure 112/78, pulse 88, temperature 99.3 F (37.4 C), temperature source Oral, resp. rate 16, height 5\' 2"  (1.575 m), weight 54.8 kg, last menstrual period 12/24/2019, SpO2 100 %.  Patient Vitals for the past 24 hrs:  BP Temp Temp src Pulse Resp SpO2 Height Weight  03/26/20 1214 112/78 99.3 F (37.4 C) Oral 88 16 100 % 5\' 2"  (1.575 m) 54.8 kg   Physical Exam  Constitutional: She is oriented to person, place, and time. She appears well-developed and well-nourished. No distress.  HENT:  Head: Normocephalic and atraumatic.  Respiratory: Effort normal.  GI: Soft.  Neurological: She is alert and oriented to person, place, and time.  Skin: Skin is warm and dry. She is not diaphoretic.  Psychiatric: She has a normal mood and affect. Her behavior is normal. Judgment and thought  content normal.   Results for orders placed or performed during the hospital encounter of 03/26/20 (from the past 24 hour(s))  Urinalysis, Routine w reflex microscopic     Status: Abnormal   Collection Time: 03/26/20 12:23 PM  Result Value Ref Range   Color, Urine YELLOW YELLOW   APPearance HAZY (A) CLEAR   Specific Gravity, Urine 1.023 1.005 - 1.030   pH 6.0 5.0 - 8.0   Glucose, UA NEGATIVE NEGATIVE mg/dL   Hgb urine dipstick NEGATIVE NEGATIVE   Bilirubin Urine NEGATIVE NEGATIVE   Ketones, ur NEGATIVE NEGATIVE mg/dL   Protein, ur NEGATIVE NEGATIVE mg/dL   Nitrite NEGATIVE NEGATIVE   Leukocytes,Ua NEGATIVE  NEGATIVE  CBC with Differential/Platelet     Status: Abnormal   Collection Time: 03/26/20  2:39 PM  Result Value Ref Range   WBC 5.3 4.0 - 10.5 K/uL   RBC 3.87 3.87 - 5.11 MIL/uL   Hemoglobin 11.7 (L) 12.0 - 15.0 g/dL   HCT 34.0 (L) 36.0 - 46.0 %   MCV 87.9 80.0 - 100.0 fL   MCH 30.2 26.0 - 34.0 pg   MCHC 34.4 30.0 - 36.0 g/dL   RDW 12.3 11.5 - 15.5 %   Platelets 157 150 - 400 K/uL   nRBC 0.0 0.0 - 0.2 %   Neutrophils Relative % 68 %   Neutro Abs 3.6 1.7 - 7.7 K/uL   Lymphocytes Relative 25 %   Lymphs Abs 1.3 0.7 - 4.0 K/uL   Monocytes Relative 7 %   Monocytes Absolute 0.4 0.1 - 1.0 K/uL   Eosinophils Relative 0 %   Eosinophils Absolute 0.0 0.0 - 0.5 K/uL   Basophils Relative 0 %   Basophils Absolute 0.0 0.0 - 0.1 K/uL   Immature Granulocytes 0 %   Abs Immature Granulocytes 0.02 0.00 - 0.07 K/uL  Comprehensive metabolic panel     Status: Abnormal   Collection Time: 03/26/20  2:39 PM  Result Value Ref Range   Sodium 133 (L) 135 - 145 mmol/L   Potassium 3.7 3.5 - 5.1 mmol/L   Chloride 101 98 - 111 mmol/L   CO2 23 22 - 32 mmol/L   Glucose, Bld 93 70 - 99 mg/dL   BUN <5 (L) 6 - 20 mg/dL   Creatinine, Ser 0.75 0.44 - 1.00 mg/dL   Calcium 9.0 8.9 - 10.3 mg/dL   Total Protein 6.4 (L) 6.5 - 8.1 g/dL   Albumin 3.3 (L) 3.5 - 5.0 g/dL   AST 14 (L) 15 - 41 U/L   ALT 10 0 - 44 U/L   Alkaline Phosphatase 32 (L) 38 - 126 U/L   Total Bilirubin 0.7 0.3 - 1.2 mg/dL   GFR calc non Af Amer >60 >60 mL/min   GFR calc Af Amer >60 >60 mL/min   Anion gap 9 5 - 15   US OB Comp Less 14 Wks  Result Date: 03/26/2020 CLINICAL DATA:  Uncertain dates. EXAM: OBSTETRIC <14 WK [place] AND TRANSVAGINAL OB US TECHNIQUE: Both transabdominal and transvaginal ultrasound examinations were performed for complete evaluation of the gestation as well as the maternal uterus, adnexal regions, and pelvic cul-de-sac. Transvaginal technique was performed to assess early pregnancy. COMPARISON:  No prior. FINDINGS:  Intrauterine gestational sac: Single Yolk sac:  Present Embryo:  Present Cardiac Activity: Present Heart Rate: 169 bpm CRL: 41.6 mm   11 w   0 d  [place] EDC: 10/15/2020 Subchorionic hemorrhage:  None visualized Maternal uterus/adnexae: Unremarkable.  No free fluid. IMPRESSION: Single viable intrauterine pregnancy at 11 weeks 0 days. Electronically Signed   By: Maisie Fus  Register   On: 03/26/2020 14:15    MAU Course  Procedures  MDM -N/V in pregnancy -bedside US performed as RN unable to Doppler FHT at bedside, on US fetal size did not appear to be 13wks, dating [place] ordered -US: single IUP, [redacted]w[redacted]d, EDD changed to 10/15/2020 -weight today 54.8kg, 0.4kg weight gain since 06/2019 -UA: hazy, otherwise WNL -CBC w/Diff: WNL -CMP: no abnormalities requiring treatment -1L LR + 20mg  Pepcid + 8mg  Zofran given, pt reports NV now resolved -pt able to urinate after fluids -PO challenge successful (RX reports patient eating soup prior to medication administration) -pt discharged to home in stable condition  Orders Placed This Encounter  Procedures  . OB Comp Less 14 Wks    Standing Status:   Standing    Number of Occurrences:   1    Order Specific Question:   Symptom/Reason for Exam    Answer:   Pregnancy with uncertain dates  . Urinalysis, Routine w reflex microscopic    Standing Status:   Standing    Number of Occurrences:   1  . CBC with Differential/Platelet    Standing Status:   Standing    Number of Occurrences:   1  . Comprehensive metabolic panel    Standing Status:   Standing    Number of Occurrences:   1  . Insert peripheral IV    Standing Status:   Standing    Number of Occurrences:   1  . Discharge patient    Order Specific Question:   Discharge disposition    Answer:   01-Home or Self Care [1]    Order Specific Question:   Discharge patient date    Answer:   03/26/2020   Meds ordered this encounter  Medications  . lactated ringers bolus 1,000 mL  .  ondansetron (ZOFRAN) 8 mg in sodium chloride 0.9 % 50 mL IVPB  . famotidine (PEPCID) IVPB 20 mg premix  . ondansetron (ZOFRAN ODT) 8 MG disintegrating tablet    Sig: Take 1 tablet (8 mg total) by mouth every 8 (eight) hours as needed for up to 7 days for nausea or vomiting.    Dispense:  21 tablet    Refill:  1    Order Specific Question:   Supervising Provider    Answer:   [[id number]] [[id number]]  . famotidine (PEPCID) 20 MG tablet    Sig: Take 1 tablet (20 mg total) by mouth daily.    Dispense:  60 tablet    Refill:  0    Order Specific Question:   Supervising Provider    Answer:   03/28/2020 [[id number]]    Assessment and Plan   1. Nausea and vomiting in pregnancy   2. Pregnancy with uncertain dates   3. [redacted] weeks gestation of pregnancy    Allergies as of 03/26/2020   No Known Allergies     Medication List    TAKE these medications   cephALEXin 500 MG capsule Commonly known as: KEFLEX Take 1 capsule (500 mg total) by mouth 4 (four) times daily.   famotidine 20 MG tablet Commonly known as: PEPCID Take 1 tablet (20 mg total) by mouth daily.   ondansetron 8 MG disintegrating tablet Commonly known as: Zofran ODT Take 1 tablet (8 mg total)  by mouth every 8 (eight) hours as needed for up to 7 days for nausea or vomiting.      -will call with culture results, if positive -safe meds in pregnancy list given -pt advised to take medications around the clock and not to stop taking if feeling better -RX Zofran -RX Pepcid -discussed nonpharmacologic and pharmacologic treatments of N/V -discussed normal expectations for N/V in pregnancy -pt discharged to home in stable condition  Joni Reining E Janaiya Beauchesne 03/26/2020, 4:35 PM

## 2020-05-13 ENCOUNTER — Encounter: Payer: Self-pay | Admitting: Obstetrics and Gynecology

## 2020-05-14 ENCOUNTER — Other Ambulatory Visit: Payer: Self-pay | Admitting: Family Medicine

## 2020-05-14 DIAGNOSIS — O285 Abnormal chromosomal and genetic finding on antenatal screening of mother: Secondary | ICD-10-CM

## 2020-05-14 DIAGNOSIS — Z3A20 20 weeks gestation of pregnancy: Secondary | ICD-10-CM

## 2020-05-27 ENCOUNTER — Ambulatory Visit: Payer: Medicaid Other | Attending: Obstetrics and Gynecology

## 2020-05-27 ENCOUNTER — Ambulatory Visit: Payer: Medicaid Other

## 2020-07-20 ENCOUNTER — Encounter (HOSPITAL_COMMUNITY): Payer: Self-pay | Admitting: Obstetrics and Gynecology

## 2020-07-20 ENCOUNTER — Other Ambulatory Visit: Payer: Self-pay

## 2020-07-20 ENCOUNTER — Inpatient Hospital Stay (HOSPITAL_COMMUNITY)
Admission: AD | Admit: 2020-07-20 | Discharge: 2020-07-20 | Disposition: A | Discharge status: Home / Self Care | Payer: Medicaid Other | Attending: Obstetrics and Gynecology | Admitting: Obstetrics and Gynecology

## 2020-07-20 DIAGNOSIS — R197 Diarrhea, unspecified: Secondary | ICD-10-CM | POA: Diagnosis not present

## 2020-07-20 DIAGNOSIS — O212 Late vomiting of pregnancy: Secondary | ICD-10-CM | POA: Diagnosis present

## 2020-07-20 DIAGNOSIS — O219 Vomiting of pregnancy, unspecified: Secondary | ICD-10-CM

## 2020-07-20 DIAGNOSIS — Z3A27 27 weeks gestation of pregnancy: Secondary | ICD-10-CM | POA: Insufficient documentation

## 2020-07-20 DIAGNOSIS — Z79899 Other long term (current) drug therapy: Secondary | ICD-10-CM | POA: Diagnosis not present

## 2020-07-20 DIAGNOSIS — O99891 Other specified diseases and conditions complicating pregnancy: Secondary | ICD-10-CM | POA: Insufficient documentation

## 2020-07-20 LAB — URINALYSIS, ROUTINE W REFLEX MICROSCOPIC
Bilirubin Urine: NEGATIVE
Glucose, UA: NEGATIVE mg/dL
Hgb urine dipstick: NEGATIVE
Ketones, ur: NEGATIVE mg/dL
Leukocytes,Ua: NEGATIVE
Nitrite: NEGATIVE
Protein, ur: NEGATIVE mg/dL
Specific Gravity, Urine: 1.01 (ref 1.005–1.030)
pH: 8 (ref 5.0–8.0)

## 2020-07-20 MED ORDER — PROMETHAZINE HCL 12.5 MG PO TABS: 12.5000 mg | ORAL_TABLET | Freq: Four times a day (QID) | ORAL | 0 refills | Status: DC | PRN

## 2020-07-20 MED ORDER — LOPERAMIDE HCL 2 MG PO CAPS
2.0000 mg | ORAL_CAPSULE | Freq: Once | ORAL | Status: AC
  Administered 2020-07-20: 2 mg via ORAL
  Filled 2020-07-20: qty 1

## 2020-07-20 NOTE — Discharge Instructions (Signed)

## 2020-07-20 NOTE — MAU Note (Addendum)
Presents with c/o vomiting and diarrhea, reports vomited 2x and had 3 diarrhea stools since yesterday.  Reports unable to keep anything down.  Denies VB or LOF.  Reports +FM.

## 2020-07-20 NOTE — MAU Provider Note (Signed)
History     CSN: [id number]  Arrival date and time: 07/20/20 0940   First Provider Initiated Contact with Patient 07/20/20 1120      Chief Complaint  Patient presents with  . Diarrhea  . Emesis   HPI Amanda Mcdonald is a 24 y.o. G1P0 at [redacted]w[redacted]d who presents to MAU with chief complaints of nausea, vomiting and diarrhea. These are new problems, onset yesterday 07/19/2020. Patient endorses two episodes of vomiting and three episodes of loose stills in the past 24 hours. She denies exposure to new foods, irritants or sick people. She has not taken medication or tried other treatments for these complaints. She denies vaginal bleeding, leaking of fluid, decreased fetal movement, fever, falls, or recent illness.   She receives prenatal care with [facility] and her next appointment is 07/28/2020.  OB History    Gravida  1   Para      Term      Preterm      AB      Living        SAB      TAB      Ectopic      Multiple      Live Births              Past Medical History:  Diagnosis Date  . Medical history non-contributory     Past Surgical History:  Procedure Laterality Date  . NO PAST SURGERIES    . WISDOM TOOTH EXTRACTION      Family History  Problem Relation Age of Onset  . Healthy Mother   . Healthy Father     Social History   Tobacco Use  . Smoking status: Never Smoker  . Smokeless tobacco: Never Used  Vaping Use  . Vaping Use: Never used  Substance Use Topics  . Alcohol use: No  . Drug use: No    Allergies: No Known Allergies  Medications Prior to Admission  Medication Sig Dispense Refill Last Dose  . Prenatal Vit-Fe Fumarate-FA (MULTIVITAMIN-PRENATAL) 27-0.8 MG TABS tablet Take 1 tablet by mouth daily at 12 noon.   07/20/2020 at 0800  . cephALEXin (KEFLEX) 500 MG capsule Take 1 capsule (500 mg total) by mouth 4 (four) times daily. 28 capsule 0   . famotidine (PEPCID) 20 MG tablet Take 1 tablet (20 mg total) by mouth daily. 60 tablet 0      Review of Systems  Gastrointestinal: Positive for diarrhea, nausea and vomiting.  All other systems reviewed and are negative.  Physical Exam   Blood pressure 118/75, pulse 82, temperature 98.4 F (36.9 C), temperature source Oral, resp. rate 18, height 5\' 5"  (1.651 m), weight 100.2 kg, last menstrual period 12/24/2019, SpO2 100 %.  Physical Exam Vitals and nursing note reviewed. Exam conducted with a chaperone present.  Constitutional:      Appearance: Normal appearance.  HENT:     Mouth/Throat:     Mouth: Mucous membranes are moist.  Cardiovascular:     Rate and Rhythm: Normal rate.     Pulses: Normal pulses.     Heart sounds: Normal heart sounds.  Pulmonary:     Effort: Pulmonary effort is normal.  Abdominal:     Tenderness: There is no abdominal tenderness.     Comments: Gravid  Genitourinary:    Comments: Deferred due to chief complaint and ROS Skin:    General: Skin is warm and dry.     Capillary Refill: Capillary refill takes less than 2  seconds.  Neurological:     Mental Status: She is alert.  Psychiatric:        Mood and Affect: Mood normal.     MAU Course  Procedures   --Reactive tracing: baseline 145, mod var, + 10 x 10 accels, no decels --Toco: quiet --Reassured diarrhea is typically self-limiting. Advised diet modification and OTC Imodium PRN  Orders Placed This Encounter  Procedures  . Urinalysis, Routine w reflex microscopic Urine, Clean Catch   Patient Vitals for the past 24 hrs:  BP Temp Temp src Pulse Resp SpO2 Height Weight  07/20/20 1140 118/75 98.4 F (36.9 C) Oral 82 18 100 % -- --  07/20/20 1111 -- -- -- -- -- -- 5\' 5"  (1.651 m) 100.2 kg  07/20/20 1002 111/65 98.5 F (36.9 C) Oral 89 20 100 % 5\' 2"  (1.575 m) 63.7 kg   Results for orders placed or performed during the hospital encounter of 07/20/20 (from the past 24 hour(s))  Urinalysis, Routine w reflex microscopic Urine, Clean Catch     Status: None   Collection Time: 07/20/20  10:35 AM  Result Value Ref Range   Color, Urine YELLOW YELLOW   APPearance CLEAR CLEAR   Specific Gravity, Urine 1.010 1.005 - 1.030   pH 8.0 5.0 - 8.0   Glucose, UA NEGATIVE NEGATIVE mg/dL   Hgb urine dipstick NEGATIVE NEGATIVE   Bilirubin Urine NEGATIVE NEGATIVE   Ketones, ur NEGATIVE NEGATIVE mg/dL   Protein, ur NEGATIVE NEGATIVE mg/dL   Nitrite NEGATIVE NEGATIVE   Leukocytes,Ua NEGATIVE NEGATIVE   Meds ordered this encounter  Medications  . loperamide (IMODIUM) capsule 2 mg  . promethazine (PHENERGAN) 12.5 MG tablet    Sig: Take 1 tablet (12.5 mg total) by mouth every 6 (six) hours as needed for nausea or vomiting.    Dispense:  20 tablet    Refill:  0    Order Specific Question:   Supervising Provider    Answer:   07/22/20 L [1095]   Assessment and Plan  --24 y.o. G1P0 at [redacted]w[redacted]d  --Reactive tracing --Diarrhea, resolving without intervention, may continue Imodium OTC PRN --Discharge home in stable condition  F/U: --[facility] 07/28/2020  [redacted]w[redacted]d, CNM 07/20/2020, 1:37 PM

## 2020-08-11 ENCOUNTER — Other Ambulatory Visit: Payer: Self-pay

## 2020-08-11 ENCOUNTER — Inpatient Hospital Stay (HOSPITAL_COMMUNITY)
Admission: AD | Admit: 2020-08-11 | Discharge: 2020-08-12 | Disposition: A | Discharge status: Home / Self Care | Payer: Medicaid Other | Attending: Family Medicine | Admitting: Family Medicine

## 2020-08-11 ENCOUNTER — Encounter (HOSPITAL_COMMUNITY): Payer: Self-pay | Admitting: Family Medicine

## 2020-08-11 DIAGNOSIS — R112 Nausea with vomiting, unspecified: Secondary | ICD-10-CM

## 2020-08-11 DIAGNOSIS — Z3A30 30 weeks gestation of pregnancy: Secondary | ICD-10-CM | POA: Insufficient documentation

## 2020-08-11 DIAGNOSIS — K219 Gastro-esophageal reflux disease without esophagitis: Secondary | ICD-10-CM | POA: Insufficient documentation

## 2020-08-11 DIAGNOSIS — O212 Late vomiting of pregnancy: Secondary | ICD-10-CM | POA: Insufficient documentation

## 2020-08-11 DIAGNOSIS — O99613 Diseases of the digestive system complicating pregnancy, third trimester: Secondary | ICD-10-CM | POA: Insufficient documentation

## 2020-08-11 DIAGNOSIS — Z79899 Other long term (current) drug therapy: Secondary | ICD-10-CM | POA: Insufficient documentation

## 2020-08-11 NOTE — MAU Note (Signed)
Pt not in lobby.  

## 2020-08-11 NOTE — MAU Note (Signed)
Pt here with reports of nausea and vomiting that started today. Reports she cannot keep any food down. Has thrown up all day today. Pt does not have any nausea medication. Was given rx for nausea in first trimester but took it all. Pt denies fever or recent sick contact. Reports good fetal movement. Pt denies contractions, LOF, or vaginal bleeding.

## 2020-08-12 DIAGNOSIS — Z3A30 30 weeks gestation of pregnancy: Secondary | ICD-10-CM | POA: Diagnosis not present

## 2020-08-12 DIAGNOSIS — R112 Nausea with vomiting, unspecified: Secondary | ICD-10-CM

## 2020-08-12 DIAGNOSIS — O99613 Diseases of the digestive system complicating pregnancy, third trimester: Secondary | ICD-10-CM | POA: Diagnosis not present

## 2020-08-12 DIAGNOSIS — O99891 Other specified diseases and conditions complicating pregnancy: Secondary | ICD-10-CM | POA: Diagnosis not present

## 2020-08-12 DIAGNOSIS — K219 Gastro-esophageal reflux disease without esophagitis: Secondary | ICD-10-CM | POA: Diagnosis not present

## 2020-08-12 DIAGNOSIS — Z79899 Other long term (current) drug therapy: Secondary | ICD-10-CM | POA: Diagnosis not present

## 2020-08-12 DIAGNOSIS — O212 Late vomiting of pregnancy: Secondary | ICD-10-CM | POA: Diagnosis not present

## 2020-08-12 DIAGNOSIS — O280 Abnormal hematological finding on antenatal screening of mother: Secondary | ICD-10-CM | POA: Insufficient documentation

## 2020-08-12 MED ORDER — ONDANSETRON 4 MG PO TBDP: 4.0000 mg | ORAL_TABLET | Freq: Four times a day (QID) | ORAL | 0 refills | Status: DC | PRN

## 2020-08-12 MED ORDER — ONDANSETRON 4 MG PO TBDP
4.0000 mg | ORAL_TABLET | Freq: Once | ORAL | Status: AC
  Administered 2020-08-12: 4 mg via ORAL
  Filled 2020-08-12: qty 1

## 2020-08-12 MED ORDER — PANTOPRAZOLE SODIUM 20 MG PO TBEC: 20.0000 mg | DELAYED_RELEASE_TABLET | Freq: Every day | ORAL | 0 refills | Status: DC

## 2020-08-12 NOTE — Discharge Instructions (Signed)

## 2020-08-12 NOTE — MAU Provider Note (Addendum)
Chief Complaint:  Nausea and Emesis   First Provider Initiated Contact with Patient 08/12/20 0002     HPI: Amanda Mcdonald is a 24 y.o. G1P0 at 2w6dwho presents to maternity admissions reporting nausea and vomiting which started today.  States has not kept down anything all day. . She reports good fetal movement, denies LOF, vaginal bleeding, vaginal itching/burning, urinary symptoms, h/a, dizziness, diarrhea, constipation or fever/chills.  Emesis  This is a new problem. The current episode started today. The problem has been unchanged. There has been no fever. Pertinent negatives include no abdominal pain, chest pain, chills, coughing, diarrhea, dizziness, fever or myalgias. She has tried nothing for the symptoms.     RN Note: Pt here with reports of nausea and vomiting that started today. Reports she cannot keep any food down. Has thrown up all day today. Pt does not have any nausea medication. Was given rx for nausea in first trimester but took it all. Pt denies fever or recent sick contact. Reports good fetal movement. Pt denies contractions, LOF, or vaginal bleeding  Past Medical History: Past Medical History:  Diagnosis Date  . Medical history non-contributory     Past obstetric history: OB History  Gravida Para Term Preterm AB Living  1            SAB TAB Ectopic Multiple Live Births               # Outcome Date GA Lbr Len/2nd Weight Sex Delivery Anes PTL Lv  1 Current             Past Surgical History: Past Surgical History:  Procedure Laterality Date  . NO PAST SURGERIES    . WISDOM TOOTH EXTRACTION      Family History: Family History  Problem Relation Age of Onset  . Healthy Mother   . Healthy Father     Social History: Social History   Tobacco Use  . Smoking status: Never Smoker  . Smokeless tobacco: Never Used  Vaping Use  . Vaping Use: Never used  Substance Use Topics  . Alcohol use: No  . Drug use: No    Allergies: No Known Allergies  Meds:   Medications Prior to Admission  Medication Sig Dispense Refill Last Dose  . Prenatal Vit-Fe Fumarate-FA (MULTIVITAMIN-PRENATAL) 27-0.8 MG TABS tablet Take 1 tablet by mouth daily at 12 noon.   08/11/2020 at Unknown time  . famotidine (PEPCID) 20 MG tablet Take 1 tablet (20 mg total) by mouth daily. 60 tablet 0 NONE  . promethazine (PHENERGAN) 12.5 MG tablet Take 1 tablet (12.5 mg total) by mouth every 6 (six) hours as needed for nausea or vomiting. 20 tablet 0 NONE    I have reviewed patient's Past Medical Hx, Surgical Hx, Family Hx, Social Hx, medications and allergies.   ROS:  Review of Systems  Constitutional: Negative for chills and fever.  Respiratory: Negative for cough.   Cardiovascular: Negative for chest pain.  Gastrointestinal: Positive for vomiting. Negative for abdominal pain and diarrhea.  Musculoskeletal: Negative for myalgias.  Neurological: Negative for dizziness.   Other systems negative  Physical Exam   Patient Vitals for the past 24 hrs:  BP Temp Temp src Pulse Resp SpO2 Height Weight  08/11/20 2144 112/71 98.8 F (37.1 C) Oral 89 18 100 % 5\' 2"  (1.575 m) 64.1 kg   Constitutional: Well-developed, well-nourished female in no acute distress.  Cardiovascular: normal rate and rhythm Respiratory: normal effort, clear to auscultation bilaterally GI: Abd  soft, non-tender, gravid appropriate for gestational age.   No rebound or guarding. MS: Extremities nontender, no edema, normal ROM Neurologic: Alert and oriented x 4.  GU: Neg CVAT.  PELVIC EXAM: deferred  FHT:  Baseline 140 , moderate variability, accelerations present, no decelerations Contractions: occasional   Labs: No results found for this or any previous visit (from the past 24 hour(s)).   Imaging:  No results found.  MAU Course/MDM: NST reviewed and is reactive and reassuring.  Treatments in MAU included Zofran orally, which produced good relief and she was able to tolerate PO intake.  She requests  the pill form and not dissolvable.  .    Assessment: Single IUP at [redacted]w[redacted]d Nausea and vomiting, question food poisoning  Plan: Discharge home Rx Zofran for prn use for nausea Rx Protonix for acid reflux Advance diet as tolerated Preterm Labor precautions and fetal kick counts Follow up in Office for prenatal visits   Encouraged to return here or to other Urgent Care/ED if she develops worsening of symptoms, increase in pain, fever, or other concerning symptoms.  Pt stable at time of discharge.  Wynelle Bourgeois CNM, MSN Certified Nurse-Midwife 08/12/2020 12:02 AM

## 2020-09-17 LAB — OB RESULTS CONSOLE GBS: GBS: NEGATIVE

## 2020-10-05 ENCOUNTER — Institutional Professional Consult (permissible substitution): Payer: Self-pay | Admitting: Pediatrics

## 2020-10-12 ENCOUNTER — Inpatient Hospital Stay (HOSPITAL_COMMUNITY)
Admission: AD | Admit: 2020-10-12 | Discharge: 2020-10-15 | DRG: 787 | Disposition: A | Discharge status: Home / Self Care | Payer: Medicaid Other | Attending: Family Medicine | Admitting: Family Medicine

## 2020-10-12 ENCOUNTER — Encounter (HOSPITAL_COMMUNITY): Payer: Self-pay | Admitting: Family Medicine

## 2020-10-12 ENCOUNTER — Other Ambulatory Visit: Payer: Self-pay

## 2020-10-12 DIAGNOSIS — O9081 Anemia of the puerperium: Secondary | ICD-10-CM | POA: Diagnosis not present

## 2020-10-12 DIAGNOSIS — Z20822 Contact with and (suspected) exposure to covid-19: Secondary | ICD-10-CM | POA: Diagnosis present

## 2020-10-12 DIAGNOSIS — O368390 Maternal care for abnormalities of the fetal heart rate or rhythm, unspecified trimester, not applicable or unspecified: Secondary | ICD-10-CM | POA: Diagnosis not present

## 2020-10-12 DIAGNOSIS — F1210 Cannabis abuse, uncomplicated: Secondary | ICD-10-CM | POA: Diagnosis present

## 2020-10-12 DIAGNOSIS — O133 Gestational [pregnancy-induced] hypertension without significant proteinuria, third trimester: Secondary | ICD-10-CM | POA: Diagnosis not present

## 2020-10-12 DIAGNOSIS — Z3A39 39 weeks gestation of pregnancy: Secondary | ICD-10-CM

## 2020-10-12 DIAGNOSIS — O280 Abnormal hematological finding on antenatal screening of mother: Secondary | ICD-10-CM | POA: Diagnosis not present

## 2020-10-12 DIAGNOSIS — O285 Abnormal chromosomal and genetic finding on antenatal screening of mother: Secondary | ICD-10-CM | POA: Diagnosis not present

## 2020-10-12 DIAGNOSIS — O99324 Drug use complicating childbirth: Secondary | ICD-10-CM | POA: Diagnosis present

## 2020-10-12 DIAGNOSIS — O134 Gestational [pregnancy-induced] hypertension without significant proteinuria, complicating childbirth: Secondary | ICD-10-CM | POA: Diagnosis present

## 2020-10-12 DIAGNOSIS — Z8759 Personal history of other complications of pregnancy, childbirth and the puerperium: Secondary | ICD-10-CM | POA: Diagnosis not present

## 2020-10-12 DIAGNOSIS — O135 Gestational [pregnancy-induced] hypertension without significant proteinuria, complicating the puerperium: Secondary | ICD-10-CM | POA: Diagnosis not present

## 2020-10-12 DIAGNOSIS — F1410 Cocaine abuse, uncomplicated: Secondary | ICD-10-CM | POA: Diagnosis present

## 2020-10-12 DIAGNOSIS — D62 Acute posthemorrhagic anemia: Secondary | ICD-10-CM | POA: Diagnosis not present

## 2020-10-12 DIAGNOSIS — Z98891 History of uterine scar from previous surgery: Secondary | ICD-10-CM | POA: Diagnosis not present

## 2020-10-12 HISTORY — DX: Gestational (pregnancy-induced) hypertension without significant proteinuria, unspecified trimester: O13.9

## 2020-10-12 LAB — COMPREHENSIVE METABOLIC PANEL
ALT: 9 U/L (ref 0–44)
AST: 20 U/L (ref 15–41)
Albumin: 2.7 g/dL — ABNORMAL LOW (ref 3.5–5.0)
Alkaline Phosphatase: 152 U/L — ABNORMAL HIGH (ref 38–126)
Anion gap: 11 (ref 5–15)
BUN: 8 mg/dL (ref 6–20)
CO2: 20 mmol/L — ABNORMAL LOW (ref 22–32)
Calcium: 8.8 mg/dL — ABNORMAL LOW (ref 8.9–10.3)
Chloride: 106 mmol/L (ref 98–111)
Creatinine, Ser: 0.86 mg/dL (ref 0.44–1.00)
GFR, Estimated: >60 mL/min (ref >60)
Glucose, Bld: 97 mg/dL (ref 70–99)
Potassium: 3.7 mmol/L (ref 3.5–5.1)
Sodium: 137 mmol/L (ref 135–145)
Total Bilirubin: 0.7 mg/dL (ref 0.3–1.2)
Total Protein: 6.5 g/dL (ref 6.5–8.1)

## 2020-10-12 LAB — CBC
HCT: 32.9 % — ABNORMAL LOW (ref 36.0–46.0)
Hemoglobin: 10.7 g/dL — ABNORMAL LOW (ref 12.0–15.0)
MCH: 28.8 pg (ref 26.0–34.0)
MCHC: 32.5 g/dL (ref 30.0–36.0)
MCV: 88.7 fL (ref 80.0–100.0)
Platelets: 162 10*3/uL (ref 150–400)
RBC: 3.71 MIL/uL — ABNORMAL LOW (ref 3.87–5.11)
RDW: 14.9 % (ref 11.5–15.5)
WBC: 6.6 10*3/uL (ref 4.0–10.5)
nRBC: 0 % (ref 0.0–0.2)

## 2020-10-12 LAB — RESPIRATORY PANEL BY RT PCR (FLU A&B, COVID)
Influenza A by PCR: NEGATIVE
Influenza B by PCR: NEGATIVE
SARS Coronavirus 2 by RT PCR: NEGATIVE

## 2020-10-12 LAB — URINALYSIS, ROUTINE W REFLEX MICROSCOPIC
Bilirubin Urine: NEGATIVE
Glucose, UA: NEGATIVE mg/dL
Hgb urine dipstick: NEGATIVE
Ketones, ur: NEGATIVE mg/dL
Leukocytes,Ua: NEGATIVE
Nitrite: NEGATIVE
Protein, ur: NEGATIVE mg/dL
Specific Gravity, Urine: 1.009 (ref 1.005–1.030)
pH: 6 (ref 5.0–8.0)

## 2020-10-12 LAB — PROTEIN / CREATININE RATIO, URINE
Creatinine, Urine: 79.16 mg/dL
Protein Creatinine Ratio: 0.11 mg/mg{Cre} (ref 0.00–0.15)
Total Protein, Urine: 9 mg/dL

## 2020-10-12 LAB — TYPE AND SCREEN
ABO/RH(D): O POS
Antibody Screen: NEGATIVE

## 2020-10-12 MED ORDER — FLEET ENEMA 7-19 GM/118ML RE ENEM: 1.0000 | ENEMA | RECTAL | Status: DC | PRN

## 2020-10-12 MED ORDER — SOD CITRATE-CITRIC ACID 500-334 MG/5ML PO SOLN
30.0000 mL | ORAL | Status: DC | PRN
  Administered 2020-10-13: 30 mL via ORAL
  Filled 2020-10-12: qty 15

## 2020-10-12 MED ORDER — LIDOCAINE HCL (PF) 1 % IJ SOLN: 30.0000 mL | INTRAMUSCULAR | Status: DC | PRN

## 2020-10-12 MED ORDER — LACTATED RINGERS IV SOLN: 500.0000 mL | INTRAVENOUS | Status: DC | PRN

## 2020-10-12 MED ORDER — OXYCODONE-ACETAMINOPHEN 5-325 MG PO TABS: 2.0000 | ORAL_TABLET | ORAL | Status: DC | PRN

## 2020-10-12 MED ORDER — MISOPROSTOL 50MCG HALF TABLET
50.0000 ug | ORAL_TABLET | ORAL | Status: DC | PRN
  Administered 2020-10-12 – 2020-10-13 (×4): 50 ug via ORAL
  Filled 2020-10-12 (×4): qty 1

## 2020-10-12 MED ORDER — ACETAMINOPHEN 325 MG PO TABS: 650.0000 mg | ORAL_TABLET | ORAL | Status: DC | PRN

## 2020-10-12 MED ORDER — OXYTOCIN-SODIUM CHLORIDE 30-0.9 UT/500ML-% IV SOLN
2.5000 [IU]/h | INTRAVENOUS | Status: DC
  Filled 2020-10-12: qty 500

## 2020-10-12 MED ORDER — OXYTOCIN BOLUS FROM INFUSION: 333.0000 mL | Freq: Once | INTRAVENOUS | Status: DC

## 2020-10-12 MED ORDER — TERBUTALINE SULFATE 1 MG/ML IJ SOLN
0.2500 mg | Freq: Once | INTRAMUSCULAR | Status: AC | PRN
  Administered 2020-10-13: 0.25 mg via SUBCUTANEOUS
  Filled 2020-10-12 (×2): qty 1

## 2020-10-12 MED ORDER — ONDANSETRON HCL 4 MG/2ML IJ SOLN
4.0000 mg | Freq: Four times a day (QID) | INTRAMUSCULAR | Status: DC | PRN
  Administered 2020-10-13: 4 mg via INTRAVENOUS
  Filled 2020-10-12: qty 2

## 2020-10-12 MED ORDER — LACTATED RINGERS IV SOLN: INTRAVENOUS | Status: DC

## 2020-10-12 MED ORDER — OXYCODONE-ACETAMINOPHEN 5-325 MG PO TABS: 1.0000 | ORAL_TABLET | ORAL | Status: DC | PRN

## 2020-10-12 NOTE — Progress Notes (Signed)
Pt informed that the ultrasound is considered a limited OB ultrasound and is not intended to be a complete ultrasound exam.  Patient also informed that the ultrasound is not being completed with the intent of assessing for fetal or placental anomalies or any pelvic abnormalities.  Explained that the purpose of today's ultrasound is to assess for  presentation.  Patient acknowledges the purpose of the exam and the limitations of the study.    Cephalic  Amanda Griep, NP   

## 2020-10-12 NOTE — H&P (Addendum)
OBSTETRIC ADMISSION HISTORY AND PHYSICAL  Amanda Mcdonald is a 24 y.o. female G1P0 with IUP at [redacted]w[redacted]d by LMP presenting for IOL for gHTN. She reports +FMs, No LOF, no VB, no blurry vision, headaches or peripheral edema, and RUQ pain.  She plans on breast feeding. She requests Depo for birth control.  She received her [facility]   Dating: By 11 wk [place] --->  Estimated Date of Delivery: 10/15/20  Sono:   @[redacted]w[redacted]d , CWD, normal anatomy, low lying anterior placenta which resolved to 2 cm on 29 wk, cephalic presentation, 1370g, EFW  Prenatal History/Complications:  Patient Active Problem List   Diagnosis Date Noted   Gestational hypertension, third trimester 10/12/2020   Abnormal multiple marker screen in fetus 08/12/2020     Past Medical History: Past Medical History:  Diagnosis Date   Medical history non-contributory    Pregnancy induced hypertension     Past Surgical History: Past Surgical History:  Procedure Laterality Date   WISDOM TOOTH EXTRACTION      Obstetrical History: OB History     Gravida  1   Para      Term      Preterm      AB      Living         SAB      TAB      Ectopic      Multiple      Live Births              Social History Social History   Socioeconomic History   Marital status: Single    Spouse name: 10/12/2020   Number of children: Not on file   Years of education: Not on file   Highest education level: Not on file  Occupational History   Not on file  Tobacco Use   Smoking status: Never Smoker   Smokeless tobacco: Never Used  Vaping Use   Vaping Use: Never used  Substance and Sexual Activity   Alcohol use: No   Drug use: No   Sexual activity: Not on file  Other Topics Concern   Not on file  Social History Narrative   Not on file   Social Determinants of Health   Financial Resource Strain:    Difficulty of Paying Living Expenses: Not on file  Food Insecurity:    Worried About Jomarie Longs in  the Last Year: Not on file   Programme researcher, broadcasting/film/video of Food in the Last Year: Not on file  Transportation Needs:    Lack of Transportation (Medical): Not on file   Lack of Transportation (Non-Medical): Not on file  Physical Activity:    Days of Exercise per Week: Not on file   Minutes of Exercise per Session: Not on file  Stress:    Feeling of Stress : Not on file  Social Connections:    Frequency of Communication with Friends and Family: Not on file   Frequency of Social Gatherings with Friends and Family: Not on file   Attends Religious Services: Not on file   Active Member of Clubs or Organizations: Not on file   Attends [employer] Meetings: Not on file   Marital Status: Not on file    Family History: Family History  Problem Relation Age of Onset   Healthy Mother    Healthy Father     Allergies: No Known Allergies  Medications Prior to Admission  Medication Sig Dispense Refill Last Dose  famotidine (PEPCID) 20 MG tablet Take 1 tablet (20 mg total) by mouth daily. 60 tablet 0    ondansetron (ZOFRAN ODT) 4 MG disintegrating tablet Take 1 tablet (4 mg total) by mouth every 6 (six) hours as needed for nausea. 20 tablet 0    pantoprazole (PROTONIX) 20 MG tablet Take 1 tablet (20 mg total) by mouth daily. 30 tablet 0    Prenatal Vit-Fe Fumarate-FA (MULTIVITAMIN-PRENATAL) 27-0.8 MG TABS tablet Take 1 tablet by mouth daily at 12 noon.      promethazine (PHENERGAN) 12.5 MG tablet Take 1 tablet (12.5 mg total) by mouth every 6 (six) hours as needed for nausea or vomiting. 20 tablet 0      Review of Systems  All systems reviewed and negative except as stated in HPI  Blood pressure (!) 147/93, pulse 89, temperature 99.1 F (37.3 C), temperature source Oral, resp. rate 18, height 5\' 5"  (1.651 m), weight 159 lb (72.1 kg), last menstrual period 12/24/2019, SpO2 100 %. General appearance: alert and no distress Heart: regular rate and rhythm Abdomen: soft, non-tender; bowel sounds  normal Extremities: no sign of DVT Presentation: cephalic on ultrasound Fetal monitoringBaseline: 135 bpm Uterine activityNone Dilation: Fingertip Effacement (%): 50 Station: Ballotable Exam by:: Dr. [email or web address]   Prenatal labs: ABO, Rh: --/--/O POS (11/08 1540) Antibody: NEG (11/08 1540) Rubella: Immune (04/19 0000) RPR: Nonreactive (04/19 0000)  HBsAg: Negative (04/19 0000)  HIV: Non-reactive (04/19 0000)  GBS: Negative/-- (10/14 0000)  1 hr Glucola 74 Genetic screening  Abnormal quad screen (increased risk of Down syndrome 1:208) Anatomy 11-21-1996 normal  Prenatal Transfer Tool  Maternal Diabetes: No Genetic Screening: Normal Maternal Ultrasounds/Referrals: Other: resolved low lying placenta  Fetal Ultrasounds or other Referrals:  None Maternal Substance Abuse:  Yes:  Type: Smoker, Marijuana, Cocaine Significant Maternal Medications:  Meds include: Other: Pepcid, zofran, protonix Significant Maternal Lab Results: Group B Strep negative  Results for orders placed or performed during the hospital encounter of 10/12/20 (from the past 24 hour(s))  Urinalysis, Routine w reflex microscopic Urine, Clean Catch   Collection Time: 10/12/20  2:25 PM  Result Value Ref Range   Color, Urine YELLOW YELLOW   APPearance CLEAR CLEAR   Specific Gravity, Urine 1.009 1.005 - 1.030   pH 6.0 5.0 - 8.0   Glucose, UA NEGATIVE NEGATIVE mg/dL   Hgb urine dipstick NEGATIVE NEGATIVE   Bilirubin Urine NEGATIVE NEGATIVE   Ketones, ur NEGATIVE NEGATIVE mg/dL   Protein, ur NEGATIVE NEGATIVE mg/dL   Nitrite NEGATIVE NEGATIVE   Leukocytes,Ua NEGATIVE NEGATIVE  Protein / creatinine ratio, urine   Collection Time: 10/12/20  2:25 PM  Result Value Ref Range   Creatinine, Urine 79.16 mg/dL   Total Protein, Urine 9 mg/dL   Protein Creatinine Ratio 0.11 0.00 - 0.15 mg/mg[Cre]  Respiratory Panel by RT PCR (Flu A&B, Covid) - Nasopharyngeal Swab   Collection Time: 10/12/20  3:20 PM   Specimen: Nasopharyngeal Swab   Result Value Ref Range   SARS Coronavirus 2 by RT PCR NEGATIVE NEGATIVE   Influenza A by PCR NEGATIVE NEGATIVE   Influenza B by PCR NEGATIVE NEGATIVE  CBC   Collection Time: 10/12/20  3:40 PM  Result Value Ref Range   WBC 6.6 4.0 - 10.5 K/uL   RBC 3.71 (L) 3.87 - 5.11 MIL/uL   Hemoglobin 10.7 (L) 12.0 - 15.0 g/dL   HCT 13/08/21 (L) 36 - 46 %   MCV 88.7 80.0 - 100.0 fL   MCH 28.8 26.0 -  34.0 pg   MCHC 32.5 30.0 - 36.0 g/dL   RDW 16.1 09.6 - 04.5 %   Platelets 162 150 - 400 K/uL   nRBC 0.0 0.0 - 0.2 %  Comprehensive metabolic panel   Collection Time: 10/12/20  3:40 PM  Result Value Ref Range   Sodium 137 135 - 145 mmol/L   Potassium 3.7 3.5 - 5.1 mmol/L   Chloride 106 98 - 111 mmol/L   CO2 20 (L) 22 - 32 mmol/L   Glucose, Bld 97 70 - 99 mg/dL   BUN 8 6 - 20 mg/dL   Creatinine, Ser 4.09 0.44 - 1.00 mg/dL   Calcium 8.8 (L) 8.9 - 10.3 mg/dL   Total Protein 6.5 6.5 - 8.1 g/dL   Albumin 2.7 (L) 3.5 - 5.0 g/dL   AST 20 15 - 41 U/L   ALT 9 0 - 44 U/L   Alkaline Phosphatase 152 (H) 38 - 126 U/L   Total Bilirubin 0.7 0.3 - 1.2 mg/dL   GFR, Estimated >81 >19 mL/min   Anion gap 11 5 - 15  Type and screen [facility]   Collection Time: 10/12/20  3:40 PM  Result Value Ref Range   ABO/RH(D) O POS    Antibody Screen NEG    Sample Expiration      10/15/2020,2359 Performed at [facility], [address] [place], [place] [address]     Patient Active Problem List   Diagnosis Date Noted   Gestational hypertension, third trimester 10/12/2020   Abnormal multiple marker screen in fetus 08/12/2020    Assessment/Plan:  CARYN GIENGER is a 24 y.o. G1P0 at [redacted]w[redacted]d here for IOL for gHTN  #Labor: Initial CVE at 0.5/50/ballotable. Start induction with cytotec and plan for foley bulb placement. #gHTN: came into MAU for labor check and found to have blood pressures with 140s-150s systolic and history of Pre-E. Also complains of recent headaches which resolved with  tylenol. No RUQ pain, visual changes. CBC,CMP wnl with hemoglobin of 11.7 and UPC of 0.11 #Pain: Plans for epidural #FWB: Cat 1 #ID:  gbs neg #MOF: breast #MOC:depo #Abnormal genetic screening: Had Quad screen which was concerning for increase risk for trisomy 21 (1:208) with corrected GA based on 11 wk [place]. Anatomy US confirmed normal lip, nose, and cardiac chambers, but poor visualization of spine. Will discuss with patient and peds will be notified.  #Covid immunity status: partially vaccinated on 08/06/20, plan on second dose before discharge  Sheila Oats, MD  10/12/2020, 6:29 PM   Attestation of Supervision of Student:  I confirm that I have verified the information documented in the  resident's  note and that I have also personally reperformed the history, physical exam and all medical decision making activities.  I have verified that all services and findings are accurately documented in this student's note; and I agree with management and plan as outlined in the documentation. I have also made any necessary editorial changes.  Sheila Oats, MD [facility], [facility] 10/12/2020 6:31 PM

## 2020-10-12 NOTE — Progress Notes (Signed)
Labor Progress Note Amanda Mcdonald is a 24 y.o. G1P0 at [redacted]w[redacted]d presented for IOL gHTN.  S: Doing well without complaints,  O:  BP 139/79   Pulse 92   Temp 98.8 F (37.1 C) (Oral)   Resp 16   Ht 5\' 5"  (1.651 m)   Wt 72.1 kg   LMP 12/24/2019   SpO2 100%   BMI 26.46 kg/m  EFM: baseline 130bpm/mod variability/+ accels/no decels Toco: irritable  CVE: Dilation: 1 Effacement (%): 50 Cervical Position: Posterior Station: -2 Presentation: Vertex Exam by:: Dr [email or web address]   A&P: 24 y.o. G1P0 [redacted]w[redacted]d IOL gHTN. #IOL: S/p cytotec x1. FB attempted at last exam but unsuccessful. Given posterior cervix on exam, will not re-attempt FB at this time. Cytotec re-dosed. #Pain: PRN #FWB: cat 1 #GBS negative #gHTN: presented to MAU for labor check and found to meet criteria for gHTN. History of preE in prior pregnancy. PreE labs nml. Asymptomatic. Continue to monitor. #Abnormal genetic screen: DSR 1:208. Patient did not follow up with genetics. Discussed with patient and answered all questions. Will make peds aware postpartum.  [redacted]w[redacted]d, MD 10:17 PM

## 2020-10-12 NOTE — MAU Note (Signed)
Pt stated she took her b/p at home and it was elevated 150/100.149/98. Reports mild headache today. C/o ctx as well q 5 min. Good fetal movement felt.

## 2020-10-13 ENCOUNTER — Inpatient Hospital Stay (HOSPITAL_COMMUNITY): Payer: Medicaid Other | Admitting: Anesthesiology

## 2020-10-13 ENCOUNTER — Encounter (HOSPITAL_COMMUNITY)
Admission: AD | Disposition: A | Discharge status: Home / Self Care | Payer: Self-pay | Source: Home / Self Care | Attending: Family Medicine

## 2020-10-13 DIAGNOSIS — Z3A39 39 weeks gestation of pregnancy: Secondary | ICD-10-CM

## 2020-10-13 DIAGNOSIS — O368390 Maternal care for abnormalities of the fetal heart rate or rhythm, unspecified trimester, not applicable or unspecified: Secondary | ICD-10-CM | POA: Diagnosis not present

## 2020-10-13 LAB — RPR: RPR Ser Ql: NONREACTIVE

## 2020-10-13 LAB — CBC
HCT: 33.8 % — ABNORMAL LOW (ref 36.0–46.0)
Hemoglobin: 11.1 g/dL — ABNORMAL LOW (ref 12.0–15.0)
MCH: 29.2 pg (ref 26.0–34.0)
MCHC: 32.8 g/dL (ref 30.0–36.0)
MCV: 88.9 fL (ref 80.0–100.0)
Platelets: 157 10*3/uL (ref 150–400)
RBC: 3.8 MIL/uL — ABNORMAL LOW (ref 3.87–5.11)
RDW: 15.1 % (ref 11.5–15.5)
WBC: 9 10*3/uL (ref 4.0–10.5)
nRBC: 0 % (ref 0.0–0.2)

## 2020-10-13 SURGERY — Surgical Case
Anesthesia: Epidural

## 2020-10-13 MED ORDER — LACTATED RINGERS IV SOLN
500.0000 mL | Freq: Once | INTRAVENOUS | Status: AC
  Administered 2020-10-13: 500 mL via INTRAVENOUS

## 2020-10-13 MED ORDER — SCOPOLAMINE 1 MG/3DAYS TD PT72
MEDICATED_PATCH | TRANSDERMAL | Status: DC | PRN
  Administered 2020-10-13: 1 via TRANSDERMAL

## 2020-10-13 MED ORDER — LACTATED RINGERS AMNIOINFUSION: INTRAVENOUS | Status: DC

## 2020-10-13 MED ORDER — DIPHENHYDRAMINE HCL 50 MG/ML IJ SOLN: 12.5000 mg | INTRAMUSCULAR | Status: DC | PRN

## 2020-10-13 MED ORDER — SODIUM CHLORIDE 0.9 % IV SOLN: INTRAVENOUS | Status: DC | PRN

## 2020-10-13 MED ORDER — SOD CITRATE-CITRIC ACID 500-334 MG/5ML PO SOLN: 30.0000 mL | ORAL | Status: DC

## 2020-10-13 MED ORDER — SODIUM CHLORIDE 0.9 % IV SOLN
500.0000 mg | INTRAVENOUS | Status: AC
  Administered 2020-10-13: 500 mg via INTRAVENOUS

## 2020-10-13 MED ORDER — TERBUTALINE SULFATE 1 MG/ML IJ SOLN
0.2500 mg | Freq: Once | INTRAMUSCULAR | Status: AC | PRN
  Administered 2020-10-13: 0.25 mg via SUBCUTANEOUS

## 2020-10-13 MED ORDER — EPHEDRINE 5 MG/ML INJ: 10.0000 mg | INTRAVENOUS | Status: DC | PRN

## 2020-10-13 MED ORDER — MORPHINE SULFATE (PF) 0.5 MG/ML IJ SOLN
INTRAMUSCULAR | Status: AC
  Filled 2020-10-13: qty 10

## 2020-10-13 MED ORDER — ONDANSETRON HCL 4 MG/2ML IJ SOLN
INTRAMUSCULAR | Status: DC | PRN
  Administered 2020-10-13: 4 mg via INTRAVENOUS

## 2020-10-13 MED ORDER — MEPERIDINE HCL 25 MG/ML IJ SOLN
INTRAMUSCULAR | Status: AC
  Filled 2020-10-13: qty 1

## 2020-10-13 MED ORDER — PROMETHAZINE HCL 25 MG/ML IJ SOLN
25.0000 mg | Freq: Once | INTRAMUSCULAR | Status: AC
  Administered 2020-10-13: 25 mg via INTRAVENOUS
  Filled 2020-10-13: qty 1

## 2020-10-13 MED ORDER — LIDOCAINE-EPINEPHRINE (PF) 2 %-1:200000 IJ SOLN
INTRAMUSCULAR | Status: DC | PRN
  Administered 2020-10-13: 5 mL via EPIDURAL

## 2020-10-13 MED ORDER — MORPHINE SULFATE (PF) 0.5 MG/ML IJ SOLN
INTRAMUSCULAR | Status: DC | PRN
Start: 2020-10-13 — End: 2020-10-13
  Administered 2020-10-13: 2 mg via INTRAVENOUS

## 2020-10-13 MED ORDER — SODIUM BICARBONATE 8.4 % IV SOLN
INTRAVENOUS | Status: DC | PRN
  Administered 2020-10-13: 30 mL via EPIDURAL

## 2020-10-13 MED ORDER — PHENYLEPHRINE 40 MCG/ML (10ML) SYRINGE FOR IV PUSH (FOR BLOOD PRESSURE SUPPORT)
80.0000 ug | PREFILLED_SYRINGE | INTRAVENOUS | Status: DC | PRN
  Filled 2020-10-13: qty 10

## 2020-10-13 MED ORDER — METOCLOPRAMIDE HCL 5 MG/ML IJ SOLN
INTRAMUSCULAR | Status: DC | PRN
  Administered 2020-10-13: 10 mg via INTRAVENOUS

## 2020-10-13 MED ORDER — FENTANYL CITRATE (PF) 100 MCG/2ML IJ SOLN
INTRAMUSCULAR | Status: AC
  Filled 2020-10-13: qty 2

## 2020-10-13 MED ORDER — LACTATED RINGERS IV SOLN: INTRAVENOUS | Status: DC | PRN

## 2020-10-13 MED ORDER — FENTANYL CITRATE (PF) 100 MCG/2ML IJ SOLN
100.0000 ug | INTRAMUSCULAR | Status: DC | PRN
  Administered 2020-10-13 (×2): 100 ug via INTRAVENOUS
  Filled 2020-10-13: qty 2

## 2020-10-13 MED ORDER — FENTANYL-BUPIVACAINE-NACL 0.5-0.125-0.9 MG/250ML-% EP SOLN
12.0000 mL/h | EPIDURAL | Status: DC | PRN
  Filled 2020-10-13: qty 250

## 2020-10-13 MED ORDER — DEXAMETHASONE SODIUM PHOSPHATE 10 MG/ML IJ SOLN
INTRAMUSCULAR | Status: AC
  Filled 2020-10-13: qty 1

## 2020-10-13 MED ORDER — MEPERIDINE HCL 25 MG/ML IJ SOLN
INTRAMUSCULAR | Status: DC | PRN
Start: 2020-10-13 — End: 2020-10-13
  Administered 2020-10-13 (×2): 12.5 mg via INTRAVENOUS

## 2020-10-13 MED ORDER — BUTORPHANOL TARTRATE 1 MG/ML IJ SOLN
INTRAMUSCULAR | Status: AC
  Filled 2020-10-13: qty 1

## 2020-10-13 MED ORDER — MORPHINE SULFATE (PF) 0.5 MG/ML IJ SOLN
INTRAMUSCULAR | Status: DC | PRN
  Administered 2020-10-13: 3 mg via EPIDURAL

## 2020-10-13 MED ORDER — PHENYLEPHRINE HCL (PRESSORS) 10 MG/ML IV SOLN
INTRAVENOUS | Status: DC | PRN
  Administered 2020-10-13: 80 ug via INTRAVENOUS

## 2020-10-13 MED ORDER — DEXAMETHASONE SODIUM PHOSPHATE 10 MG/ML IJ SOLN
INTRAMUSCULAR | Status: DC | PRN
  Administered 2020-10-13: 10 mg via INTRAVENOUS

## 2020-10-13 MED ORDER — ONDANSETRON HCL 4 MG/2ML IJ SOLN
INTRAMUSCULAR | Status: AC
  Filled 2020-10-13: qty 2

## 2020-10-13 MED ORDER — SODIUM CHLORIDE (PF) 0.9 % IJ SOLN
INTRAMUSCULAR | Status: DC | PRN
  Administered 2020-10-13: 12 mL/h via EPIDURAL

## 2020-10-13 MED ORDER — SODIUM BICARBONATE 8.4 % IV SOLN
INTRAVENOUS | Status: DC | PRN
  Administered 2020-10-13: 3 mL via EPIDURAL
  Administered 2020-10-13: 10 mL via EPIDURAL

## 2020-10-13 MED ORDER — PHENYLEPHRINE 40 MCG/ML (10ML) SYRINGE FOR IV PUSH (FOR BLOOD PRESSURE SUPPORT): 80.0000 ug | PREFILLED_SYRINGE | INTRAVENOUS | Status: DC | PRN

## 2020-10-13 MED ORDER — SODIUM CHLORIDE 0.9 % IV SOLN
INTRAVENOUS | Status: AC
  Filled 2020-10-13: qty 500

## 2020-10-13 MED ORDER — CEFAZOLIN SODIUM-DEXTROSE 2-4 GM/100ML-% IV SOLN
2.0000 g | INTRAVENOUS | Status: AC
  Administered 2020-10-13: 2 g via INTRAVENOUS

## 2020-10-13 MED ORDER — OXYTOCIN-SODIUM CHLORIDE 30-0.9 UT/500ML-% IV SOLN
1.0000 m[IU]/min | INTRAVENOUS | Status: DC
  Administered 2020-10-13: 2 m[IU]/min via INTRAVENOUS

## 2020-10-13 MED ORDER — BUTORPHANOL TARTRATE 1 MG/ML IJ SOLN
2.0000 mg | Freq: Once | INTRAMUSCULAR | Status: AC
  Administered 2020-10-13: 2 mg via INTRAVENOUS
  Filled 2020-10-13: qty 2

## 2020-10-13 SURGICAL SUPPLY — 29 items
BENZOIN TINCTURE PRP APPL 2/3 (GAUZE/BANDAGES/DRESSINGS) ×3 IMPLANT
CHLORAPREP W/TINT 26ML (MISCELLANEOUS) ×3 IMPLANT
CLAMP CORD UMBIL (MISCELLANEOUS) IMPLANT
CLOSURE WOUND 1/2 X4 (GAUZE/BANDAGES/DRESSINGS) ×1
DRSG OPSITE POSTOP 4X10 (GAUZE/BANDAGES/DRESSINGS) ×3 IMPLANT
ELECT REM PT RETURN 9FT ADLT (ELECTROSURGICAL) ×3
ELECTRODE REM PT RTRN 9FT ADLT (ELECTROSURGICAL) ×1 IMPLANT
EXTRACTOR VACUUM M CUP 4 TUBE (SUCTIONS) IMPLANT
EXTRACTOR VACUUM M CUP 4' TUBE (SUCTIONS)
GLOVE BIOGEL PI IND STRL 6.5 (GLOVE) ×1 IMPLANT
GLOVE BIOGEL PI IND STRL 7.0 (GLOVE) ×1 IMPLANT
GLOVE BIOGEL PI INDICATOR 6.5 (GLOVE) ×2
GLOVE BIOGEL PI INDICATOR 7.0 (GLOVE) ×2
GLOVE SURG SS PI 6.5 STRL IVOR (GLOVE) ×3 IMPLANT
GOWN STRL REUS W/TWL LRG LVL3 (GOWN DISPOSABLE) ×6 IMPLANT
KIT ABG SYR 3ML LUER SLIP (SYRINGE) IMPLANT
NEEDLE HYPO 25X5/8 SAFETYGLIDE (NEEDLE) IMPLANT
NS IRRIG 1000ML POUR BTL (IV SOLUTION) ×3 IMPLANT
PACK C SECTION WH (CUSTOM PROCEDURE TRAY) ×3 IMPLANT
PAD OB MATERNITY 4.3X12.25 (PERSONAL CARE ITEMS) ×3 IMPLANT
PENCIL SMOKE EVAC W/HOLSTER (ELECTROSURGICAL) ×3 IMPLANT
RTRCTR C-SECT PINK 25CM LRG (MISCELLANEOUS) IMPLANT
STRIP CLOSURE SKIN 1/2X4 (GAUZE/BANDAGES/DRESSINGS) ×2 IMPLANT
SUT PLAIN 0 NONE (SUTURE) IMPLANT
SUT VIC AB 0 CT1 36 (SUTURE) ×12 IMPLANT
SUT VIC AB 4-0 KS 27 (SUTURE) ×3 IMPLANT
TOWEL OR 17X24 6PK STRL BLUE (TOWEL DISPOSABLE) ×3 IMPLANT
TRAY FOLEY W/BAG SLVR 14FR LF (SET/KITS/TRAYS/PACK) ×3 IMPLANT
WATER STERILE IRR 1000ML POUR (IV SOLUTION) ×6 IMPLANT

## 2020-10-13 NOTE — Progress Notes (Signed)
LABOR PROGRESS NOTE  Amanda Mcdonald is a 24 y.o. G1P0 at [redacted]w[redacted]d  admitted for IOL 2/2 gHTN.   Subjective: Doing well, resting comfortably.   Objective: BP 134/80   Pulse 90   Temp 98.9 F (37.2 C) (Oral)   Resp 17   Ht 5\' 5"  (1.651 m)   Wt 72.1 kg   LMP 12/24/2019   SpO2 98%   BMI 26.46 kg/m  or  Vitals:   10/13/20 1800 10/13/20 1830 10/13/20 1900 10/13/20 1931  BP: 113/73 121/82 128/78 134/80  Pulse: 93 92 100 90  Resp: 18 18 18 17   Temp:    98.9 F (37.2 C)  TempSrc:    Oral  SpO2:      Weight:      Height:       Dilation: 5 Effacement (%): 70 Cervical Position: Middle Station: 0 Presentation: Vertex Exam by:: , RN FHT: baseline rate 140, moderate varibility, 15 x 15 acel, late decels Toco: every 7 mins  Labs: Lab Results  Component Value Date   WBC 9.0 10/13/2020   HGB 11.1 (L) 10/13/2020   HCT 33.8 (L) 10/13/2020   MCV 88.9 10/13/2020   PLT 157 10/13/2020    Patient Active Problem List   Diagnosis Date Noted  . Gestational hypertension, third trimester 10/12/2020  . Abnormal multiple marker screen in fetus 08/12/2020    Assessment / Plan: 23 y.o. G1P0 at [redacted]w[redacted]d here for IOL 2/2 gHTN.   gHTN BP wnl. -Continue to monitor  Labor: s/p cyto/FB, Terb, Pit d/c 2/2 late decels,  Fetal Wellbeing: Cat II, change maternity position- consider amnioinfusion Pain Control:  Epidural Anticipated MOD:  Vaginal   Amanda Buitron Autry-Lott, DO 10/13/2020, 8:53 PM PGY-2, [facility]

## 2020-10-13 NOTE — Progress Notes (Signed)
Patient ID: Amanda Mcdonald, female   DOB: 06-26-1996, 26 y.o.   MRN: [id number] Vitals:   10/13/20 1830 10/13/20 1900 10/13/20 1931 10/13/20 2124  BP: 121/82 128/78 134/80   Pulse: 92 100 90   Resp: 18 18 17    Temp:   98.9 F (37.2 C) 98.7 F (37.1 C)  TempSrc:   Oral Oral  SpO2:      Weight:      Height:       FHR continues with late decelerations Baseline variability is good Has intermittent accelerations Pitocin turned off ealier  Will start amnioinfusion

## 2020-10-13 NOTE — Progress Notes (Signed)
Labor Progress Note Amanda Mcdonald is a 24 y.o. G1P0 at [redacted]w[redacted]d presented for IOL 2/2 gHTN.   S: Doing well with recent epidural placement. Recent position changes for late fetal decelerations. Pit stopped 1635, terb given 1636. Fetal HR recovered with high fowlers and cessation of contractions.   O:  BP 134/78   Pulse (!) 114   Temp 98.9 F (37.2 C) (Oral)   Resp 15   Ht 5\' 5"  (1.651 m)   Wt 72.1 kg   LMP 12/24/2019   SpO2 94%   BMI 26.46 kg/m  EFM: baseline 135 bpm / moderate variability / accels present, three early decels starting 1630 with recovery to baseline  CVE: Dilation: 4 Effacement (%): 70 Cervical Position: Posterior Station: 0 Presentation: Vertex Exam by:: [email or web address] RNC   A&P: 24 y.o. G1P0 [redacted]w[redacted]d presented for IOL 2/2 gHTN.  #Labor: Progressing well. S/p cytotec x4. FB in 0215, out at 1300. Pit 0216.  Terb 1636 for three early decels. Fetal HR recovered to 150s after repositioning and cessation of contractions.  #Pain: IV fentanyl prn #FWB: Cat 2 for three early decels, since resolved, now reassuring tracing #GBS negative #Abnormal genetic screening: Had Quad screen which was concerning for increase risk for trisomy 21 (1:208) with corrected GA based on 11 wk [id number]. Anatomy US confirmed normal lip, nose, and cardiac chambers, but poor visualization of spine. Will discuss with patient and peds will be notified.  #Covid immunity status: partially vaccinated on 08/06/20, plan on second dose before discharge  10/06/20, MD 4:56 PM

## 2020-10-13 NOTE — Anesthesia Preprocedure Evaluation (Signed)
Anesthesia Evaluation  Patient identified by MRN, date of birth, ID band Patient awake    Reviewed: Allergy & Precautions, NPO status , Patient's Chart, lab work & pertinent test results  Airway Mallampati: II  TM Distance: >3 FB Neck ROM: Full    Dental no notable dental hx.    Pulmonary neg pulmonary ROS,    Pulmonary exam normal breath sounds clear to auscultation       Cardiovascular hypertension (PIH), Normal cardiovascular exam Rhythm:Regular Rate:Normal     Neuro/Psych negative neurological ROS  negative psych ROS   GI/Hepatic negative GI ROS, Neg liver ROS,   Endo/Other  negative endocrine ROS  Renal/GU negative Renal ROS  negative genitourinary   Musculoskeletal negative musculoskeletal ROS (+)   Abdominal   Peds  Hematology negative hematology ROS (+)   Anesthesia Other Findings IOL for PIH  Reproductive/Obstetrics (+) Pregnancy                             Anesthesia Physical Anesthesia Plan  ASA: III  Anesthesia Plan: Epidural   Post-op Pain Management:    Induction:   PONV Risk Score and Plan: Treatment may vary due to age or medical condition  Airway Management Planned: Natural Airway  Additional Equipment:   Intra-op Plan:   Post-operative Plan:   Informed Consent: I have reviewed the patients History and Physical, chart, labs and discussed the procedure including the risks, benefits and alternatives for the proposed anesthesia with the patient or authorized representative who has indicated his/her understanding and acceptance.       Plan Discussed with: Anesthesiologist  Anesthesia Plan Comments: (Patient identified. Risks, benefits, options discussed with patient including but not limited to bleeding, infection, nerve damage, paralysis, failed block, incomplete pain control, headache, blood pressure changes, nausea, vomiting, reactions to medication,  itching, and post partum back pain. Confirmed with bedside nurse the patient's most recent platelet count. Confirmed with the patient that they are not taking any anticoagulation, have any bleeding history or any family history of bleeding disorders. Patient expressed understanding and wishes to proceed. All questions were answered. )        Anesthesia Quick Evaluation

## 2020-10-13 NOTE — Progress Notes (Signed)
Patient ID: Amanda Mcdonald, female   DOB: Oct 27, 1996, 24 y.o.   MRN: [id number]  In to meet pt; she is breathing through some ctx and is exhausted; in the process of receiving Stadol/Phenergan to help her get some rest; she is s/p cytotec x 4 doses (most recent 0627) and still has a cervical foley in place (since 0215)  BP 145/97, 134/81 P 76 FHR 120s, +accels, no decels, Cat 1 Irreg ctx per toco Cx deferred (was 1-2/50/vtx -2 per RN exam at 0630)  IUP@39 .5wks gHTN (stable pressures/neg labs) Unfavorable cx  Try to allow for pt to rest with IV pain meds Plan to start Pitocin when cervical foley comes out Anticipate vag del  Amanda Mcdonald CNM 10/13/2020

## 2020-10-13 NOTE — Progress Notes (Signed)
Dr. Germaine Pomfret discussing risks and benefits of cesarean section at this time. Attending Dr. Jolayne Panther called and en route to pt. Room.

## 2020-10-13 NOTE — Discharge Instructions (Signed)

## 2020-10-13 NOTE — Anesthesia Procedure Notes (Signed)
Epidural Patient location during procedure: OB Start time: 10/13/2020 3:50 PM End time: 10/13/2020 4:00 PM  Staffing Anesthesiologist: Elmer Picker, MD Performed: anesthesiologist   Preanesthetic Checklist Completed: patient identified, IV checked, risks and benefits discussed, monitors and equipment checked, pre-op evaluation and timeout performed  Epidural Patient position: sitting Prep: DuraPrep and site prepped and draped Patient monitoring: continuous pulse ox, blood pressure, heart rate and cardiac monitor Approach: midline Location: L3-L4 Injection technique: LOR air  Needle:  Needle type: Tuohy  Needle gauge: 17 G Needle length: 9 cm Needle insertion depth: 4 cm Catheter type: closed end flexible Catheter size: 19 Gauge Catheter at skin depth: 10 cm Test dose: negative  Assessment Sensory level: T8 Events: blood not aspirated, injection not painful, no injection resistance, no paresthesia and negative IV test  Additional Notes Patient identified. Risks/Benefits/Options discussed with patient including but not limited to bleeding, infection, nerve damage, paralysis, failed block, incomplete pain control, headache, blood pressure changes, nausea, vomiting, reactions to medication both or allergic, itching and postpartum back pain. Confirmed with bedside nurse the patient's most recent platelet count. Confirmed with patient that they are not currently taking any anticoagulation, have any bleeding history or any family history of bleeding disorders. Patient expressed understanding and wished to proceed. All questions were answered. Sterile technique was used throughout the entire procedure. Please see nursing notes for vital signs. Test dose was given through epidural catheter and negative prior to continuing to dose epidural or start infusion. Warning signs of high block given to the patient including shortness of breath, tingling/numbness in hands, complete motor block,  or any concerning symptoms with instructions to call for help. Patient was given instructions on fall risk and not to get out of bed. All questions and concerns addressed with instructions to call with any issues or inadequate analgesia.  Reason for block:procedure for pain

## 2020-10-13 NOTE — Discharge Summary (Addendum)
Postpartum Discharge Summary    Patient Name: Amanda Mcdonald DOB: May 25, 1996 MRN: [id number]  Date of admission: 10/12/2020 Delivery date:10/13/2020  Delivering provider: CONSTANT, PEGGY  Date of discharge: 10/15/2020  Admitting diagnosis: Gestational hypertension, third trimester [O13.3] Intrauterine pregnancy: [redacted]w[redacted]d    Secondary diagnosis:  Active Problems:   Abnormal multiple marker screen in fetus   Gestational hypertension, third trimester   Cesarean delivery delivered   Non-reassuring fetal heart rate or rhythm affecting management of mother  Additional problems: none    Discharge diagnosis: Term Pregnancy Delivered and Gestational Hypertension                                              Post partum procedures:none Augmentation: Pitocin, Cytotec and IP Foley Complications: None  Hospital course: Induction of Labor With Cesarean Section   24y.o. yo G1P0 at 337w5das admitted to the hospital 10/12/2020 for induction of labor. Patient presented and was augmented with above, FHT then demonstrated recurrent deep variable and late decelerations when patient was at 4cm cervical dilation so decision was made to proceed with cesarean delivery. The patient went for cesarean section due to Non-Reassuring FHR. Delivery details are as follows: Membrane Rupture Time/Date: 4:35 PM ,10/13/2020   Delivery Method:C-Section, Low Transverse  Details of operation can be found in separate operative Note.  Patient had an uncomplicated postpartum course. She is ambulating, tolerating a regular diet, passing flatus, and urinating well.  Patient is discharged home in stable condition on 10/15/20.      Newborn Data: Birth date:10/13/2020  Birth time:10:22 PM  Gender:Female  Living status:Living  Apgars:8 ,9  Weight:3320 g                                 Magnesium Sulfate received: No BMZ received: No Rhophylac:N/A MMR:N/A T-DaP:Given prenatally Flu: No Transfusion:No  Physical exam   Vitals:   10/14/20 1625 10/15/20 0110 10/15/20 0901 10/15/20 1429  BP: 140/87 140/86 (!) 139/96 124/86  Pulse: 81 80 78 86  Resp: '16 16 18   ' Temp: 98.7 F (37.1 C) 98.6 F (37 C) 99 F (37.2 C) 99.3 F (37.4 C)  TempSrc: Oral Oral Oral Oral  SpO2: 99% 100% 100%   Weight:      Height:       General: alert, cooperative and no distress Lochia: appropriate Uterine Fundus: firm Incision: Healing well with no significant drainage DVT Evaluation: No evidence of DVT seen on physical exam. Labs: Lab Results  Component Value Date   WBC 12.8 (H) 10/14/2020   HGB 9.6 (L) 10/14/2020   HCT 29.5 (L) 10/14/2020   MCV 88.6 10/14/2020   PLT 147 (L) 10/14/2020   CMP Latest Ref Rng & Units 10/14/2020  Glucose 70 - 99 mg/dL -  BUN 6 - 20 mg/dL -  Creatinine 0.44 - 1.00 mg/dL 1.04(H)  Sodium 135 - 145 mmol/L -  Potassium 3.5 - 5.1 mmol/L -  Chloride 98 - 111 mmol/L -  CO2 22 - 32 mmol/L -  Calcium 8.9 - 10.3 mg/dL -  Total Protein 6.5 - 8.1 g/dL -  Total Bilirubin 0.3 - 1.2 mg/dL -  Alkaline Phos 38 - 126 U/L -  AST 15 - 41 U/L -  ALT 0 - 44 U/L -  Edinburgh Score: Edinburgh Postnatal Depression Scale Screening Tool 10/14/2020  I have been able to laugh and see the funny side of things. 0  I have looked forward with enjoyment to things. 0  I have blamed myself unnecessarily when things went wrong. 0  I have been anxious or worried for no good reason. 0  I have felt scared or panicky for no good reason. 0  Things have been getting on top of me. 0  I have been so unhappy that I have had difficulty sleeping. 0  I have felt sad or miserable. 0  I have been so unhappy that I have been crying. 0  The thought of harming myself has occurred to me. 0  Edinburgh Postnatal Depression Scale Total 0     After visit meds:  Allergies as of 10/15/2020   No Known Allergies     Medication List    STOP taking these medications   acetaminophen 325 MG tablet Commonly known as: TYLENOL    famotidine 20 MG tablet Commonly known as: PEPCID   nitrofurantoin (macrocrystal-monohydrate) 100 MG capsule Commonly known as: MACROBID   ondansetron 4 MG disintegrating tablet Commonly known as: Zofran ODT   pantoprazole 20 MG tablet Commonly known as: PROTONIX   promethazine 12.5 MG tablet Commonly known as: PHENERGAN     TAKE these medications   amLODipine 5 MG tablet Commonly known as: NORVASC Take 1 tablet (5 mg total) by mouth daily. Start taking on: October 16, 2020   ferrous sulfate 325 (65 FE) MG tablet Take 1 tablet (325 mg total) by mouth every other day. Start taking on: October 16, 2020   ibuprofen 800 MG tablet Commonly known as: ADVIL Take 1 tablet (800 mg total) by mouth every 8 (eight) hours.   multivitamin-prenatal 27-0.8 MG Tabs tablet Take 1 tablet by mouth daily at 12 noon.   oxyCODONE-acetaminophen 5-325 MG tablet Commonly known as: PERCOCET/ROXICET Take 1-2 tablets by mouth every 4 (four) hours as needed for moderate pain.        Discharge home in stable condition Infant Feeding: Breast Infant Disposition:home with mother Discharge instruction: per After Visit Summary and Postpartum booklet. Activity: Advance as tolerated. Pelvic rest for 6 weeks.  Diet: routine diet Future Appointments:No future appointments. Follow up Visit: Patient instructed to make follow up appt with [facility] for postpartum visit, appt made at [facility] for BP and incision check in 1 week.   Please schedule this patient for a In person postpartum visit in 4 weeks with the following provider: Any provider. Additional Postpartum F/U:Incision check 1 week and BP check 1 week  High risk pregnancy complicated by: HTN Delivery mode:  C-Section, Low Transverse  Anticipated Birth Control:  Depo   10/15/2020 Janet Berlin, MD

## 2020-10-13 NOTE — Op Note (Signed)
Amanda Mcdonald PROCEDURE DATE: 10/12/2020 - 10/13/2020  PREOPERATIVE DIAGNOSIS: Intrauterine pregnancy at  [redacted]w[redacted]d weeks gestation; non-reassuring fetal status  POSTOPERATIVE DIAGNOSIS: The same  PROCEDURE:     Cesarean Section  SURGEON:  Dr. Catalina Antigua  ASSISTANT: Dr. Mart Piggs  INDICATIONS: Amanda Mcdonald is a 24 y.o. G1P0 at [redacted]w[redacted]d scheduled for cesarean section secondary to non-reassuring fetal status.  The risks of cesarean section discussed with the patient included but were not limited to: bleeding which may require transfusion or reoperation; infection which may require antibiotics; injury to bowel, bladder, ureters or other surrounding organs; injury to the fetus; need for additional procedures including hysterectomy in the event of a life-threatening hemorrhage; placental abnormalities wth subsequent pregnancies, incisional problems, thromboembolic phenomenon and other postoperative/anesthesia complications. The patient concurred with the proposed plan, giving informed written consent for the procedure.    FINDINGS:  Viable female infant in cephalic presentation.  Apgars 8 and 9.  Clear amniotic fluid.  Intact placenta, three vessel cord.  Normal uterus, fallopian tubes and ovaries bilaterally.  ANESTHESIA:    Spinal INTRAVENOUS FLUIDS:2300 ml ESTIMATED BLOOD LOSS: 340 ml URINE OUTPUT:  300 ml SPECIMENS: Placenta sent to L&D COMPLICATIONS: None immediate  PROCEDURE IN DETAIL:  The patient received intravenous antibiotics and had sequential compression devices applied to her lower extremities while in the preoperative area.  She was then taken to the operating room where anesthesia was induced and was found to be adequate. A foley catheter was placed into her bladder and attached to Amanda Mcdonald gravity. She was then placed in a dorsal supine position with a leftward tilt, and prepped and draped in a sterile manner. After an adequate timeout was performed, a Pfannenstiel skin  incision was made with scalpel and carried through to the underlying layer of fascia. The fascia was incised in the midline and this incision was extended bilaterally using the Mayo scissors. Kocher clamps were applied to the superior aspect of the fascial incision and the underlying rectus muscles were dissected off bluntly. A similar process was carried out on the inferior aspect of the facial incision. The rectus muscles were separated in the midline bluntly and the peritoneum was entered bluntly. The Alexis self-retaining retractor was introduced into the abdominal cavity. Attention was turned to the lower uterine segment where a transverse hysterotomy was made with a scalpel and extended bilaterally bluntly. The infant was successfully delivered, and delayed cord clamping was performed for 1 minute. The cord was clamped and cut and infant was handed over to awaiting neonatology team. Uterine massage was then administered and the placenta delivered intact with three-vessel cord. The uterus was cleared of clot and debris.  The hysterotomy was closed with 0 Vicryl in a running locked fashion, and an imbricating layer was also placed with a 0 Vicryl. Overall, excellent hemostasis was noted. The pelvis copiously irrigated and cleared of all clot and debris. Hemostasis was confirmed on all surfaces.  The peritoneum and the muscles were reapproximated using 0 vicryl interrupted stitches. The fascia was then closed using 0 Vicryl in a running fashion.  The skin was closed in a subcuticular fashion using 3.0 Vicryl. The patient tolerated the procedure well. Sponge, lap, instrument and needle counts were correct x 2. She was taken to the recovery room in stable condition.    Amanda Mcdonald  10/13/2020 10:45 PM

## 2020-10-13 NOTE — Progress Notes (Signed)
Labor Progress Note Amanda Mcdonald is a 24 y.o. G1P0 at [redacted]w[redacted]d presented for IOL 2/2 gHTN.   S: Doing well. Reports increased cramping and pain recently. Would like IV pain meds. Thinking about when to ask for epidural.   O:  BP 140/73   Pulse 72   Temp 98.6 F (37 C) (Oral)   Resp 18   Ht 5\' 5"  (1.651 m)   Wt 72.1 kg   LMP 12/24/2019   SpO2 100%   BMI 26.46 kg/m  EFM: baseline 125 bpm / moderate variability /accels present, no decels  CVE: Dilation: 4 Effacement (%): 70, 80 Cervical Position: Posterior Station: 0 Presentation: Vertex Exam by:: Dr [email or web address]   A&P: 24 y.o. G1P0 [redacted]w[redacted]d presented for IOL 2/2 gHTN.  #Labor: Progressing well. S/p cytotec x4. FB in 0215, out at 1300. Will begin pitocin, increase 2x2.  #Pain: IV fentanyl prn #FWB: Cat 1  #GBS negative #Abnormal genetic screening: Had Quad screen which was concerning for increase risk for trisomy 21 (1:208) with corrected GA based on 11 wk 0216. Anatomy US confirmed normal lip, nose, and cardiac chambers, but poor visualization of spine. Will discuss with patient and peds will be notified.  #Covid immunity status: partially vaccinated on 08/06/20, plan on second dose before discharge  10/06/20, MD 1:28 PM

## 2020-10-13 NOTE — Progress Notes (Signed)
Labor Progress Note Amanda Mcdonald is a 24 y.o. G1P0 at [redacted]w[redacted]d presented for IOL gHTN.  S: Doing well without complaints.  O:  BP 112/79   Pulse 70   Temp 98.8 F (37.1 C) (Oral)   Resp 16   Ht 5\' 5"  (1.651 m)   Wt 72.1 kg   LMP 12/24/2019   SpO2 100%   BMI 26.46 kg/m  EFM: baseline 130bpm/mod variability/+ accels/no decels Toco: intermittent, q3-7 minutes  CVE: Dilation: 1 Effacement (%): 50 Cervical Position: Posterior Station: -2 Presentation: Vertex Exam by:: Dr [email or web address]   A&P: 24 y.o. G1P0 [redacted]w[redacted]d IOL gHTN. #IOL: S/p cytotec x2. FB placed this exam without difficulty. Cytotec re-dosed. #Pain: PRN #FWB: cat 1 #GBS negative #gHTN: presented to [facility]U for labor check and found to meet criteria for gHTN. History of preE in prior pregnancy. PreE labs nml. Asymptomatic. Continue to monitor. #Abnormal genetic screen: DSR 1:208. Patient did not follow up with genetics. Discussed with patient and answered all questions. Will make peds aware postpartum.  [redacted]w[redacted]d, MD 2:21 AM

## 2020-10-13 NOTE — Transfer of Care (Signed)
Immediate Anesthesia Transfer of Care Note  Patient: Gilles Chiquito  Procedure(s) Performed: CESAREAN SECTION (N/A )  Patient Location: PACU  Anesthesia Type:Epidural  Level of Consciousness: awake, alert  and oriented  Airway & Oxygen Therapy: Patient Spontanous Breathing  Post-op Assessment: Report given to RN and Post -op Vital signs reviewed and stable  Post vital signs: Reviewed and stable  Last Vitals:  Vitals Value Taken Time  BP 127/65 10/13/20 2306  Temp 37 C 10/13/20 2304  Pulse 116 10/13/20 2309  Resp 22 10/13/20 2309  SpO2 97 % 10/13/20 2309  Vitals shown include unvalidated device data.  Last Pain:  Vitals:   10/13/20 2124  TempSrc: Oral  PainSc:          Complications: No complications documented.

## 2020-10-13 NOTE — Progress Notes (Signed)
Labor Progress Note Amanda Mcdonald is a 24 y.o. G1P0 at [redacted]w[redacted]d presented for IOL gHTN.  S: Strip reviewed.  O:  BP 134/81   Pulse 73   Temp 97.6 F (36.4 C) (Oral)   Resp 16   Ht 5\' 5"  (1.651 m)   Wt 72.1 kg   LMP 12/24/2019   SpO2 100%   BMI 26.46 kg/m  EFM: baseline 120bpm/mod variability/+ accels/no decels Toco: intermittent  CVE: Dilation: 1.5 Effacement (%): 50 Cervical Position: Posterior Station: -2 Presentation: Vertex Exam by:: [email or web address], RN   A&P: 24 y.o. G1P0 [redacted]w[redacted]d IOL gHTN. #IOL: S/p cytotec x3. FB in place. RN checked patient with thick cervix around FB and re-dosed cytotec. #Pain: PRN #FWB: cat 1 #GBS negative #gHTN: presented to MAU for labor check and found to meet criteria for gHTN. History of preE in prior pregnancy. PreE labs nml. Asymptomatic. Continue to monitor. #Abnormal genetic screen: DSR 1:208. Patient did not follow up with genetics. Discussed with patient and answered all questions. Will make peds aware postpartum.  [redacted]w[redacted]d, MD 7:39 AM

## 2020-10-13 NOTE — Progress Notes (Signed)
Called to bedside given recurrent deep variable and late decelerations of FHT. Given patient's cervical dilation is 4cm, suspect cesarean section is safest delivery modality at this time. The risks of cesarean section were discussed with the patient including but were not limited to: bleeding which may require transfusion or reoperation; infection which may require antibiotics; injury to bowel, bladder, ureters or other surrounding organs; injury to the fetus; need for additional procedures including hysterectomy in the event of a life-threatening hemorrhage; placental abnormalities wth subsequent pregnancies, incisional problems, thromboembolic phenomenon and other postoperative/anesthesia complications.  Patient has been NPO since yesterday, she will remain NPO for procedure. Anesthesia and OR aware.  Preoperative prophylactic antibiotics and SCDs ordered on call to the OR.  To OR when ready. Dr. Jolayne Panther subsequently to bedside to discuss risks/benefits with patient as well and consent forms signed.  Alric Seton, MD OB Fellow, Faculty [facility], Center for [facility] 10/13/2020 9:47 PM

## 2020-10-14 ENCOUNTER — Encounter (HOSPITAL_COMMUNITY): Payer: Self-pay | Admitting: Family Medicine

## 2020-10-14 DIAGNOSIS — Z8759 Personal history of other complications of pregnancy, childbirth and the puerperium: Secondary | ICD-10-CM

## 2020-10-14 DIAGNOSIS — O135 Gestational [pregnancy-induced] hypertension without significant proteinuria, complicating the puerperium: Secondary | ICD-10-CM

## 2020-10-14 DIAGNOSIS — O285 Abnormal chromosomal and genetic finding on antenatal screening of mother: Secondary | ICD-10-CM

## 2020-10-14 DIAGNOSIS — Z98891 History of uterine scar from previous surgery: Secondary | ICD-10-CM

## 2020-10-14 LAB — CBC
HCT: 29.5 % — ABNORMAL LOW (ref 36.0–46.0)
Hemoglobin: 9.6 g/dL — ABNORMAL LOW (ref 12.0–15.0)
MCH: 28.8 pg (ref 26.0–34.0)
MCHC: 32.5 g/dL (ref 30.0–36.0)
MCV: 88.6 fL (ref 80.0–100.0)
Platelets: 147 10*3/uL — ABNORMAL LOW (ref 150–400)
RBC: 3.33 MIL/uL — ABNORMAL LOW (ref 3.87–5.11)
RDW: 14.8 % (ref 11.5–15.5)
WBC: 12.8 10*3/uL — ABNORMAL HIGH (ref 4.0–10.5)
nRBC: 0 % (ref 0.0–0.2)

## 2020-10-14 LAB — CREATININE, SERUM
Creatinine, Ser: 1.04 mg/dL — ABNORMAL HIGH (ref 0.44–1.00)
GFR, Estimated: >60 mL/min (ref >60)

## 2020-10-14 MED ORDER — NALOXONE HCL 4 MG/10ML IJ SOLN
1.0000 ug/kg/h | INTRAVENOUS | Status: DC | PRN
  Filled 2020-10-14: qty 5

## 2020-10-14 MED ORDER — COCONUT OIL OIL: 1.0000 "application " | TOPICAL_OIL | Status: DC | PRN

## 2020-10-14 MED ORDER — DIPHENHYDRAMINE HCL 50 MG/ML IJ SOLN: 12.5000 mg | INTRAMUSCULAR | Status: DC | PRN

## 2020-10-14 MED ORDER — DIPHENHYDRAMINE HCL 25 MG PO CAPS
25.0000 mg | ORAL_CAPSULE | ORAL | Status: DC | PRN
  Filled 2020-10-14: qty 1

## 2020-10-14 MED ORDER — MENTHOL 3 MG MT LOZG: 1.0000 | LOZENGE | OROMUCOSAL | Status: DC | PRN

## 2020-10-14 MED ORDER — MEPERIDINE HCL 25 MG/ML IJ SOLN: 6.2500 mg | INTRAMUSCULAR | Status: DC | PRN

## 2020-10-14 MED ORDER — KETOROLAC TROMETHAMINE 30 MG/ML IJ SOLN
INTRAMUSCULAR | Status: AC
  Filled 2020-10-14: qty 1

## 2020-10-14 MED ORDER — FERROUS SULFATE 325 (65 FE) MG PO TABS
325.0000 mg | ORAL_TABLET | ORAL | Status: DC
  Administered 2020-10-14: 325 mg via ORAL
  Filled 2020-10-14: qty 1

## 2020-10-14 MED ORDER — ACETAMINOPHEN 500 MG PO TABS
1000.0000 mg | ORAL_TABLET | Freq: Four times a day (QID) | ORAL | Status: AC
  Administered 2020-10-14 (×3): 1000 mg via ORAL
  Filled 2020-10-14 (×4): qty 2

## 2020-10-14 MED ORDER — MEDROXYPROGESTERONE ACETATE 150 MG/ML IM SUSP
150.0000 mg | Freq: Once | INTRAMUSCULAR | Status: AC
  Administered 2020-10-15: 150 mg via INTRAMUSCULAR
  Filled 2020-10-14: qty 1

## 2020-10-14 MED ORDER — SCOPOLAMINE 1 MG/3DAYS TD PT72: 1.0000 | MEDICATED_PATCH | Freq: Once | TRANSDERMAL | Status: DC

## 2020-10-14 MED ORDER — OXYTOCIN-SODIUM CHLORIDE 30-0.9 UT/500ML-% IV SOLN
2.5000 [IU]/h | INTRAVENOUS | Status: AC
  Administered 2020-10-14: 2.5 [IU]/h via INTRAVENOUS
  Filled 2020-10-14: qty 500

## 2020-10-14 MED ORDER — NALOXONE HCL 0.4 MG/ML IJ SOLN: 0.4000 mg | INTRAMUSCULAR | Status: DC | PRN

## 2020-10-14 MED ORDER — NALBUPHINE HCL 10 MG/ML IJ SOLN: 5.0000 mg | Freq: Once | INTRAMUSCULAR | Status: DC | PRN

## 2020-10-14 MED ORDER — ONDANSETRON HCL 4 MG/2ML IJ SOLN: 4.0000 mg | Freq: Three times a day (TID) | INTRAMUSCULAR | Status: DC | PRN

## 2020-10-14 MED ORDER — NALBUPHINE HCL 10 MG/ML IJ SOLN: 5.0000 mg | INTRAMUSCULAR | Status: DC | PRN

## 2020-10-14 MED ORDER — KETOROLAC TROMETHAMINE 30 MG/ML IJ SOLN
30.0000 mg | Freq: Four times a day (QID) | INTRAMUSCULAR | Status: AC | PRN
  Administered 2020-10-14 (×4): 30 mg via INTRAVENOUS
  Filled 2020-10-14 (×3): qty 1

## 2020-10-14 MED ORDER — OXYCODONE-ACETAMINOPHEN 5-325 MG PO TABS
1.0000 | ORAL_TABLET | ORAL | Status: DC | PRN
  Administered 2020-10-15: 1 via ORAL
  Filled 2020-10-14 (×2): qty 1

## 2020-10-14 MED ORDER — GABAPENTIN 100 MG PO CAPS
100.0000 mg | ORAL_CAPSULE | Freq: Three times a day (TID) | ORAL | Status: DC
  Administered 2020-10-14 – 2020-10-15 (×4): 100 mg via ORAL
  Filled 2020-10-14 (×4): qty 1

## 2020-10-14 MED ORDER — WITCH HAZEL-GLYCERIN EX PADS: 1.0000 "application " | MEDICATED_PAD | CUTANEOUS | Status: DC | PRN

## 2020-10-14 MED ORDER — TETANUS-DIPHTH-ACELL PERTUSSIS 5-2.5-18.5 LF-MCG/0.5 IM SUSY: 0.5000 mL | PREFILLED_SYRINGE | Freq: Once | INTRAMUSCULAR | Status: DC

## 2020-10-14 MED ORDER — VITAMIN C 250 MG PO TABS
250.0000 mg | ORAL_TABLET | ORAL | Status: DC
  Administered 2020-10-14: 250 mg via ORAL
  Filled 2020-10-14: qty 1

## 2020-10-14 MED ORDER — DIPHENHYDRAMINE HCL 25 MG PO CAPS
25.0000 mg | ORAL_CAPSULE | Freq: Four times a day (QID) | ORAL | Status: DC | PRN
  Administered 2020-10-14: 25 mg via ORAL

## 2020-10-14 MED ORDER — PRENATAL MULTIVITAMIN CH
1.0000 | ORAL_TABLET | Freq: Every day | ORAL | Status: DC
  Administered 2020-10-14 – 2020-10-15 (×2): 1 via ORAL
  Filled 2020-10-14 (×2): qty 1

## 2020-10-14 MED ORDER — ENOXAPARIN SODIUM 40 MG/0.4ML ~~LOC~~ SOLN: 40.0000 mg | SUBCUTANEOUS | Status: DC

## 2020-10-14 MED ORDER — DIBUCAINE (PERIANAL) 1 % EX OINT: 1.0000 "application " | TOPICAL_OINTMENT | CUTANEOUS | Status: DC | PRN

## 2020-10-14 MED ORDER — IBUPROFEN 800 MG PO TABS
800.0000 mg | ORAL_TABLET | Freq: Three times a day (TID) | ORAL | Status: DC
  Administered 2020-10-15 (×3): 800 mg via ORAL
  Filled 2020-10-14 (×3): qty 1

## 2020-10-14 MED ORDER — SENNOSIDES-DOCUSATE SODIUM 8.6-50 MG PO TABS
2.0000 | ORAL_TABLET | ORAL | Status: DC
  Administered 2020-10-14 – 2020-10-15 (×2): 2 via ORAL
  Filled 2020-10-14 (×2): qty 2

## 2020-10-14 MED ORDER — FENTANYL CITRATE (PF) 100 MCG/2ML IJ SOLN: 25.0000 ug | INTRAMUSCULAR | Status: DC | PRN

## 2020-10-14 MED ORDER — LACTATED RINGERS IV SOLN: INTRAVENOUS | Status: DC

## 2020-10-14 MED ORDER — SIMETHICONE 80 MG PO CHEW: 80.0000 mg | CHEWABLE_TABLET | ORAL | Status: DC

## 2020-10-14 MED ORDER — AMLODIPINE BESYLATE 5 MG PO TABS
5.0000 mg | ORAL_TABLET | Freq: Every day | ORAL | Status: DC
  Administered 2020-10-14 – 2020-10-15 (×2): 5 mg via ORAL
  Filled 2020-10-14 (×2): qty 1

## 2020-10-14 MED ORDER — SIMETHICONE 80 MG PO CHEW: 80.0000 mg | CHEWABLE_TABLET | ORAL | Status: DC | PRN

## 2020-10-14 MED ORDER — KETOROLAC TROMETHAMINE 30 MG/ML IJ SOLN: 30.0000 mg | Freq: Four times a day (QID) | INTRAMUSCULAR | Status: AC | PRN

## 2020-10-14 MED ORDER — SODIUM CHLORIDE 0.9% FLUSH: 3.0000 mL | INTRAVENOUS | Status: DC | PRN

## 2020-10-14 MED ORDER — SIMETHICONE 80 MG PO CHEW
80.0000 mg | CHEWABLE_TABLET | Freq: Three times a day (TID) | ORAL | Status: DC
  Administered 2020-10-14 – 2020-10-15 (×5): 80 mg via ORAL
  Filled 2020-10-14 (×5): qty 1

## 2020-10-14 NOTE — Progress Notes (Signed)
Subjective: POD#1 pLTCS NRFHT  Patient is doing well without complaints. Ambulating without difficulty. Foley catheter still in place, passing flatus. Tolerating PO. Abdominal pain improved. Vaginal bleeding decreased.   Objective: Vital signs in last 24 hours: Temp:  [98 F (36.7 C)-99 F (37.2 C)] 98.1 F (36.7 C) (11/10 0400) Pulse Rate:  [72-123] 75 (11/10 0245) Resp:  [11-25] 18 (11/10 0630) BP: (113-145)/(65-97) 140/88 (11/10 0245) SpO2:  [84 %-100 %] 99 % (11/10 0630)  Physical Exam:  General: alert, cooperative and no distress Lochia: appropriate Uterine Fundus: firm Incision: honeycomb dressing c/d/i DVT Evaluation: No evidence of DVT seen on physical exam.  Recent Labs    10/13/20 1509 10/14/20 0515  HGB 11.1* 9.6*  HCT 33.8* 29.5*    Assessment/Plan: POD#1 pLTCS NRFHT  -doing well without complaints  -monitor for UOP s/p catheter removal  -desires depo, ordered  -breast and bottle feeding, doing well  #Abnormal quad screen  -did not meet with genetics previously  -peds team notified in OR  #gHTN  -asymptomatic  -BP has remained elevated postpartum  -will initiate meds if necesssary  Plan to discharge POD#3 given time of delivery.  Alric Seton 10/14/2020, 7:11 AM

## 2020-10-14 NOTE — Clinical Social Work Maternal (Signed)
CLINICAL SOCIAL WORK MATERNAL/CHILD NOTE  Patient Details  Name: Amanda Mcdonald MRN: [id number] Date of Birth: 25-Nov-1996  Date:  10/14/2020  Clinical Social Worker Initiating Note:  Hortencia Pilar. LCSW Date/Time: Initiated:  10/14/20/0915     Child's Name:  Amanda Mcdonald   Biological Parents:  Mother, Father Ke Maryum, Batterson)   Need for Interpreter:  None   Reason for Referral:  Current Substance Use/Substance Use During Pregnancy    Address: [address] [place], [place] [address]   Phone number:  [phone] (home)     Additional phone number: none  Household Members/Support Persons (HM/SP):   Household Member/Support Person 1   HM/SP Name Relationship DOB or Age  HM/SP -1  Brittainy Ekstein MOB 25-Jun-1996  HM/SP -2 Jomarie Longs Sides FOB unknown  HM/SP -3        HM/SP -4        HM/SP -5        HM/SP -6        HM/SP -7        HM/SP -8          Natural Supports (not living in the home):  Parent, Extended Family   Professional Supports: None   Employment: Unemployed   Type of Work: MOB reports that she plans to locate a job after she has healed from birth.   Education:  High school graduate   Homebound arranged:  n/a  Financial Resources:  Medicaid   Other Resources:  Sales executive , [facility]   Cultural/Religious Considerations Which May Impact Care:  none reported.   Strengths:  Ability to meet basic needs , Compliance with medical plan , Home prepared for child , Pediatrician chosen (MOB reported that she wanted [facility].)   Psychotropic Medications:      None at this time.    Pediatrician:    [place]  Pediatrician List:   [facility]  [facility]      Pediatrician Fax Number:    Risk Factors/Current Problems:  Substance Use    Cognitive State:  Alert , Able to Concentrate , Insightful    Mood/Affect:  Relaxed , Interested , Calm     CSW Assessment: CSW consulted due to [facility] use during pregnancy. CSW went to speak with MOB at bedside to address further needs.   CSW congratulated MOB and FOB on the birth of infant. CSW advised MOB of the HIPPA policy in which MOB expressed that it was for FOB to remain in the room while CSW spoke with her. CSW understanding and got permission to speak with MOB with FOB in the room. CSW then advised MOB of CSW's role and the reason for CSW coming to speak with her. MOB expressed that she did use THC during her second trimester of pregnancy. MOB expressed that it helped with her nausea and helped her gain an appetite. CSW understanding and asked MOB when her last use was. MOB reported that her last use was "in my second trimester". CSW understanding and advised MOB Of the hospital drug screen policy. MOB was advised that if infants UDS and CDS returns positive then CSW would need to make a CPS report . MOB expressed that she understood and reported no other substance use while pregnant. MOB expressed that she had no other questions regarding drug screen policy.  CSW inquired from [facility] on her mental  health hx in which MOB expressed that she has none. MOB expressed hx of speak with a therapist but did report interest in having a therapist for the post partum period. CSW offered MOB resources in which MOB expressed a desire for CSW to establish care at [facility]. CSW called to set appointment however MOB was advised that next appointment isn't until January 2022 therefore MOB expressed that she would do walk in at this facility. MOB expressed that she has no barriers to getting to appointments or to further care. MOB expressed that she has all needed items to care for infant with plans for infant to sleep in own room and bed once arrived home. MOB and FOB expressed that they get [facility] and Food Stamps. MOB advised CSW that she had no other needs. MOB denies SI and HI to this CSW.  CSW took time to  provide MOB with PPD education. MOB was given PPD Checklist in order to keep track of feelings. CSW will continue to monitor infants CDS and UDS and make CPS report if warranted. No barriers to d/c.   CSW Plan/Description:  No Further Intervention Required/No Barriers to Discharge, Perinatal Mood and Anxiety Disorder (PMADs) Education, Sudden Infant Death Syndrome (SIDS) Education, CSW Will Continue to Monitor Umbilical Cord Tissue Drug Screen Results and Make Report if [facility] Drug Screen Policy Information    Robb Matar, LCSWA 10/14/2020, 10:04 AM

## 2020-10-14 NOTE — Anesthesia Postprocedure Evaluation (Signed)
Anesthesia Post Note  Patient: Amanda Mcdonald  Procedure(s) Performed: CESAREAN SECTION (N/A )     Patient location during evaluation: PACU Anesthesia Type: Epidural Level of consciousness: awake and alert Pain management: pain level controlled Vital Signs Assessment: post-procedure vital signs reviewed and stable Respiratory status: spontaneous breathing and respiratory function stable Cardiovascular status: blood pressure returned to baseline and stable Postop Assessment: epidural receding Anesthetic complications: no   No complications documented.  Last Vitals:  Vitals:   10/14/20 0029 10/14/20 0030  BP:    Pulse:    Resp: 19 18  Temp:    SpO2:      Last Pain:  Vitals:   10/14/20 0012  TempSrc: Axillary  PainSc:    Pain Goal:                Epidural/Spinal Function Cutaneous sensation: Tingles (10/14/20 0030), Patient able to flex knees: Yes (10/14/20 0030), Patient able to lift hips off bed: No (10/14/20 0030), Back pain beyond tenderness at insertion site: No (10/14/20 0030), Progressively worsening motor and/or sensory loss: No (10/14/20 0030), Bowel and/or bladder incontinence post epidural: No (10/14/20 0030)  Kennieth Rad

## 2020-10-14 NOTE — Addendum Note (Signed)
Addendum  created 10/14/20 0758 by Renford Dills, CRNA   Clinical Note Signed

## 2020-10-14 NOTE — Lactation Note (Signed)
This note was copied from a baby's chart. Lactation Consultation Note Baby 6 hrs old. Baby is sleeping. Mom stated the baby BF for 30 minutes. Denies painful latch. Hand expression taught. Mom excited to see colostrum. Newborn behavior, feeding habits, STS, I&O, breast massage, milk storage, supply and demand discussed. Mom excited to see colostrum. Mom stated she is holding baby d/t FOB had to leave for a little bit and she wanted to keep the baby in the bed with her  D/t unable to get her out of the bed if she cries. Has to wait on FOB to get back. Mom has compressible breast tissue. Hand pump given for pre-pumping to evert nipple more. Encouraged mom to call for assistance or questions. Lactation brochure given.  Patient Name: Girl Lamonica Trueba ZTIWP'Y Date: 10/14/2020 Reason for consult: Initial assessment;Primapara;Term   Maternal Data Has patient been taught Hand Expression?: Yes Does the patient have breastfeeding experience prior to this delivery?: No  Feeding Feeding Type: Breast Fed  LATCH Score Latch: Too sleepy or reluctant, no latch achieved, no sucking elicited.  Audible Swallowing: None  Type of Nipple: Inverted  Comfort (Breast/Nipple): Soft / non-tender  Hold (Positioning): Assistance needed to correctly position infant at breast and maintain latch.  LATCH Score: 7  Interventions Interventions: Breast feeding basics reviewed;Breast massage;Expressed milk;Hand express;Breast compression;Hand pump  Lactation Tools Discussed/Used Tools: Pump Breast pump type: Manual WIC Program: Yes Pump Review: Setup, frequency, and cleaning;Milk Storage Initiated by:: Peri Jefferson RN IBCLC Date initiated:: 10/14/20   Consult Status Consult Status: Follow-up Date: 10/14/20 Follow-up type: In-patient    Charyl Dancer 10/14/2020, 5:19 AM

## 2020-10-14 NOTE — Lactation Note (Signed)
This note was copied from a baby's chart. Lactation Consultation Note  Patient Name: [name] UPBDH'D Date: 10/14/2020  Baby girl [name] now 37 hours old.  Content laying next to mom swaddled on arrival. Mom repoorts she plans to breastfed and formula feed.   Reviewed how to have a healthy milk supply and urged mom to always offer the breast first prior to giving formula.   Discussed pumping everytime a bottle given and mom reports she plans to do both. So denied pumping with DEBP at this time.  Mom reports she was given a manual pump.  Mom reports she has not used it but it was demonstrated how to use when given to her.  Mom reports she will be going back to work.  Mom reports she does not have a DEBP for home use. Mom reports she is on [facility] in [place]. Urged to feed on cue and 8-12 or more times day.  Discussed newborn behavior.  Urged to call lactation as needed.   Maternal Data    Feeding Feeding Type: Bottle Fed - Formula  LATCH Score                   Interventions    Lactation Tools Discussed/Used     Consult Status      Paige Monarrez Michaelle Copas 10/14/2020, 12:42 PM

## 2020-10-14 NOTE — Anesthesia Postprocedure Evaluation (Signed)
Anesthesia Post Note  Patient: Amanda Mcdonald  Procedure(s) Performed: CESAREAN SECTION (N/A )     Patient location during evaluation: Mother Baby Anesthesia Type: Epidural Level of consciousness: awake and alert Pain management: pain level controlled Vital Signs Assessment: post-procedure vital signs reviewed and stable Respiratory status: spontaneous breathing, nonlabored ventilation and respiratory function stable Cardiovascular status: stable Postop Assessment: no headache, no backache and epidural receding Anesthetic complications: no   No complications documented.  Last Vitals:  Vitals:   10/14/20 0630 10/14/20 0751  BP:  (!) 156/97  Pulse:  90  Resp: 18 18  Temp:  36.6 C  SpO2: 99% 100%    Last Pain:  Vitals:   10/14/20 0751  TempSrc: Oral  PainSc:    Pain Goal:                   Arlene Brickel

## 2020-10-15 ENCOUNTER — Other Ambulatory Visit (HOSPITAL_COMMUNITY): Payer: Self-pay | Admitting: Obstetrics and Gynecology

## 2020-10-15 DIAGNOSIS — Z98891 History of uterine scar from previous surgery: Secondary | ICD-10-CM

## 2020-10-15 MED ORDER — IBUPROFEN 800 MG PO TABS
800.0000 mg | ORAL_TABLET | Freq: Three times a day (TID) | ORAL | 0 refills | Status: DC
Start: 2020-10-15 — End: 2020-10-15

## 2020-10-15 MED ORDER — AMLODIPINE BESYLATE 5 MG PO TABS
5.0000 mg | ORAL_TABLET | Freq: Every day | ORAL | 0 refills | Status: DC
Start: 2020-10-16 — End: 2020-10-16

## 2020-10-15 MED ORDER — OXYCODONE-ACETAMINOPHEN 5-325 MG PO TABS
1.0000 | ORAL_TABLET | ORAL | 0 refills | Status: DC | PRN
Start: 2020-10-15 — End: 2020-10-15

## 2020-10-15 MED ORDER — FERROUS SULFATE 325 (65 FE) MG PO TABS
325.0000 mg | ORAL_TABLET | ORAL | 3 refills | Status: DC
Start: 2020-10-16 — End: 2020-10-16

## 2020-10-15 MED FILL — FERROUS SULFATE 325 MG TAB: 325 (65 FE) | 30 days supply | Qty: 15 | Fill #0

## 2020-10-15 MED FILL — AMLODIPINE BESYLATE 5 MG TA: 5 | 30 days supply | Qty: 30 | Fill #0

## 2020-10-15 MED FILL — IBUPROFEN 800 MG TAB: 800 | 10 days supply | Qty: 30 | Fill #0

## 2020-10-15 MED FILL — OXYCODONE-APAP 5-325MG: 5-325 | 2 days supply | Qty: 15 | Fill #0

## 2020-10-15 NOTE — Progress Notes (Addendum)
POSTPARTUM PROGRESS NOTE  POD #2  Subjective:  Amanda Mcdonald is a 24 y.o. G1P1001 s/p pLTCS at [redacted]w[redacted]d.  She reports she doing well. No acute events overnight. She denies any problems with ambulating, voiding or po intake. Denies nausea or vomiting. She has passed flatus. Pain is moderately controlled.  Lochia is appropriate.  Objective: Blood pressure 140/86, pulse 80, temperature 98.6 F (37 C), temperature source Oral, resp. rate 16, height 5\' 5"  (1.651 m), weight 72.1 kg, last menstrual period 12/24/2019, SpO2 100 %, unknown if currently breastfeeding.  Physical Exam:  General: alert, cooperative and no distress Chest: no respiratory distress Heart: distal pulses intact Abdomen: soft, nontender,  Uterine Fundus: firm, appropriately tender DVT Evaluation: No calf swelling or tenderness Extremities: No LE edema Skin: warm, dry; incision clean/dry/intact w/ honeycomb dressing in place  Recent Labs    10/13/20 1509 10/14/20 0515  HGB 11.1* 9.6*  HCT 33.8* 29.5*    Assessment/Plan: Amanda Mcdonald is a 24 y.o. G1P1001 s/p pLCTS at [redacted]w[redacted]d for NRFHT.  POD#2- Doing welll; pain moderately controlled. H/H appropriate  Routine postpartum care  OOB, ambulated  Lovenox for VTE prophylaxis gHTN  Continue amlodipine 5 mg, consider increase prior to dc  asymptomatic Anemia: asymptomatic  Po ferrous sulfate BID Contraception: Depo Feeding: Breast  Dispo: Plan for discharge home tomorrow.   LOS: 3 days   [redacted]w[redacted]d, DO 10/15/2020, 5:16 AM PGY-2, [facility]  GME ATTESTATION:  I saw and evaluated the patient. I agree with the findings and the plan of care as documented in the resident's note.  13/10/2020, MD OB Fellow, Faculty [facility], Center for [facility] 10/15/2020 5:50 AM

## 2020-10-15 NOTE — Progress Notes (Signed)
Transition of Care RX (TOC) closes at 1700, they will be able to deliver home meds to pt today prior to d/c.

## 2020-10-22 ENCOUNTER — Ambulatory Visit: Payer: Medicaid Other

## 2020-12-05 NOTE — L&D Delivery Note (Addendum)
OB/GYN Faculty Practice Delivery Note  Amanda Mcdonald is a 25 y.o. G2P1001 s/p VBAC after TOLAC at [redacted]w[redacted]d. She was admitted for SOL.   ROM: 12h 23m with clear fluid GBS Status: Negative Maximum Maternal Temperature: 98.5  Labor Progress: Presented with contractions, found to be 4 cm, augmented with pitocin and progressed to complete.   Delivery Date/Time: [phone] on 11/23/2021 Delivery: Called to room and patient was complete and pushing. Head delivered ROA. No nuchal cord present. Shoulder and body delivered in usual fashion. Infant with spontaneous cry, placed on mother's abdomen, dried and stimulated. Cord clamped x 2 after 1-minute delay, and cut by father of baby. Cord blood drawn. Placenta delivered spontaneously with gentle cord traction. Fundus firm with massage and Pitocin.   At that time a post placental liletta was placed at the fundus manually and the strings were trimmed ~0.5cm inside the introitus.  Labia, perineum, vagina, and cervix inspected and found to have a left periurethral that was repaired with two interrupted stitches.   Placenta: intact, 3V cord, L&D Complications: None Lacerations: Periurethral EBL: 100cc Analgesia: Epidural and local lidocaine for repair   Infant: female   APGARs 9,9   weight pending  Warner Mccreedy, MD, MPH OB Fellow, Faculty Practice Center for [facility], [facility] 11/23/2021, 9:00 AM

## 2021-03-01 IMAGING — US US OB COMP LESS 14 WK
1 series · 15 of 24 positions shown · non-contrast
Comparison: No prior.

CLINICAL DATA: Uncertain dates.

EXAM:
OBSTETRIC <14 WK US AND TRANSVAGINAL OB US
TECHNIQUE: Both transabdominal and transvaginal ultrasound examinations were
performed for complete evaluation of the gestation as well as the
maternal uterus, adnexal regions, and pelvic cul-de-sac.
Transvaginal technique was performed to assess early pregnancy.

[Series 1: us ob comp less 14 wk · 15 of 24 slices shown]
[im 1/24]
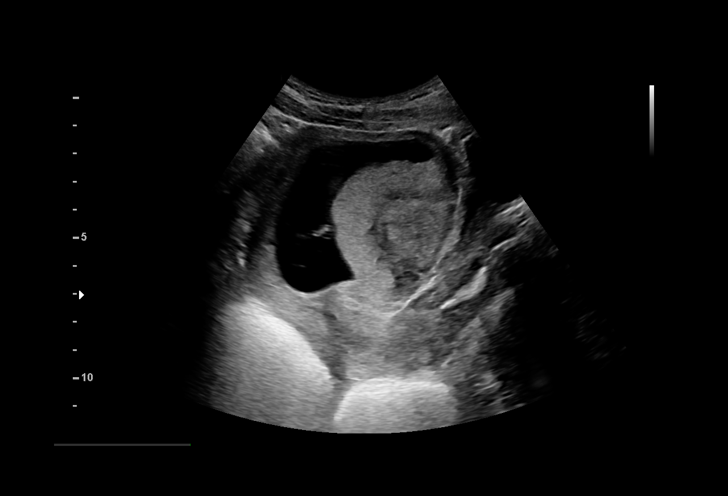
[im 3/24]
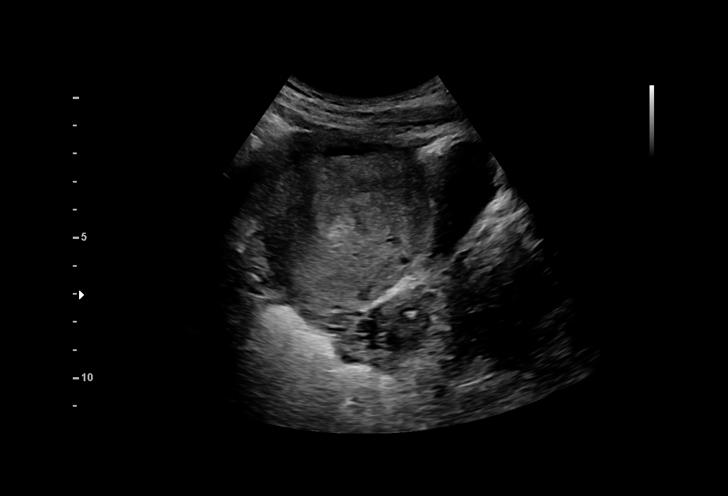
[im 5/24]
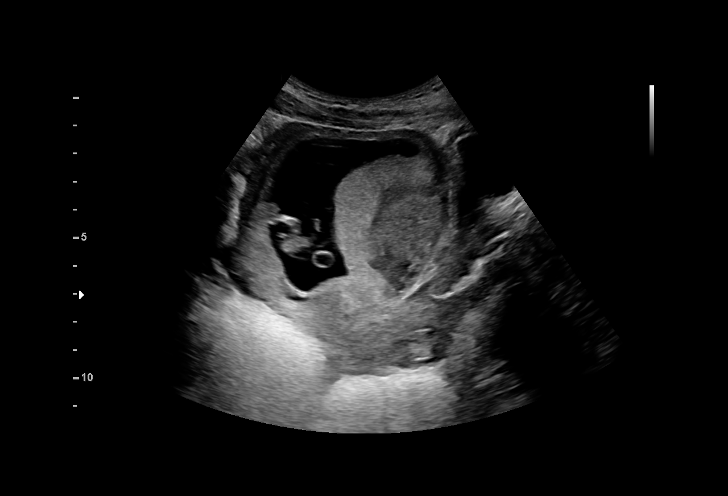
[im 6/24]
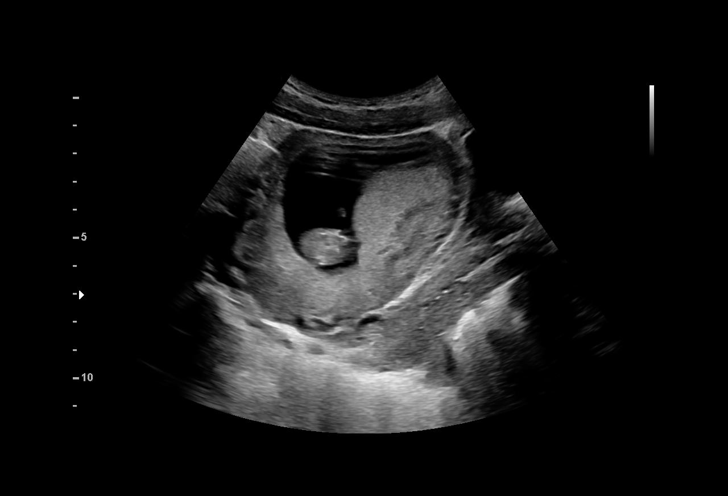
[im 8/24]
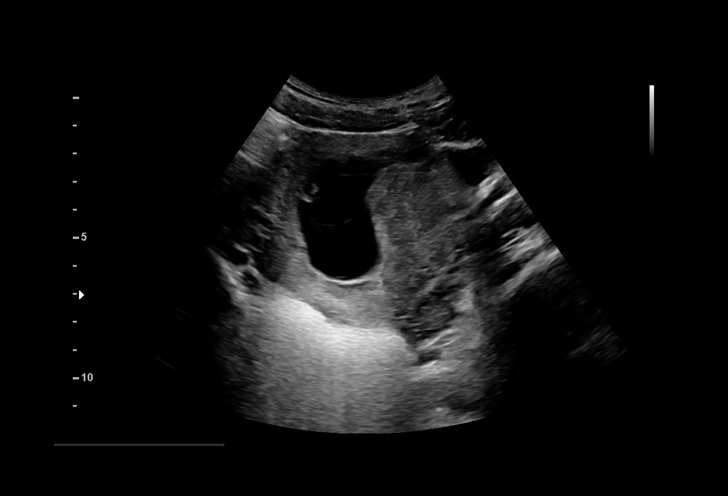
[im 9/24]
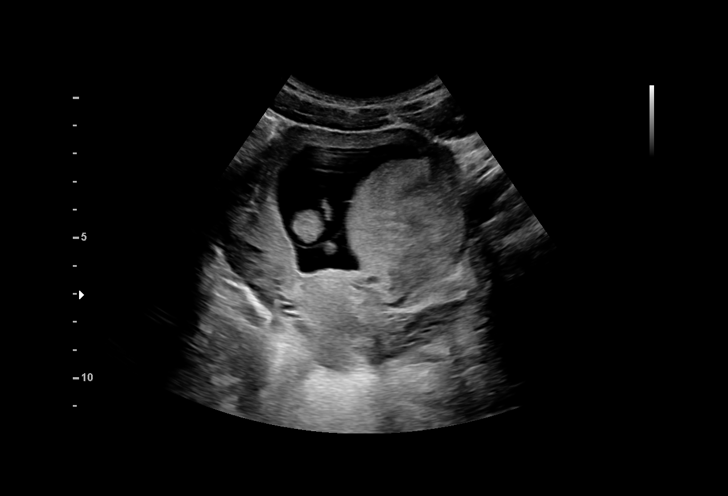
[im 11/24]
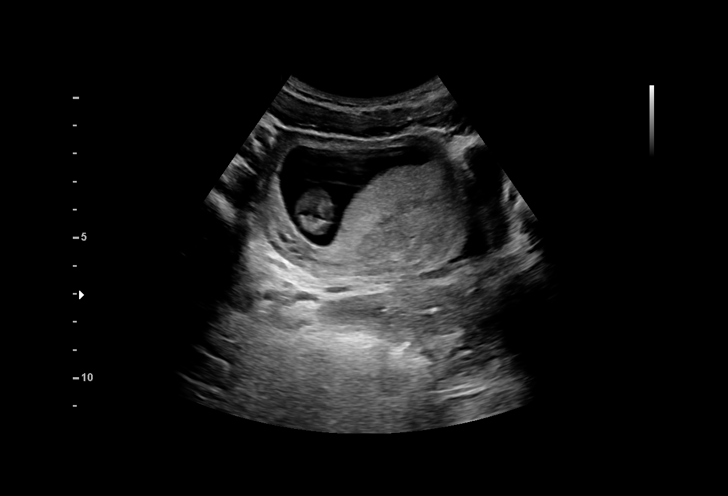
[im 13/24]
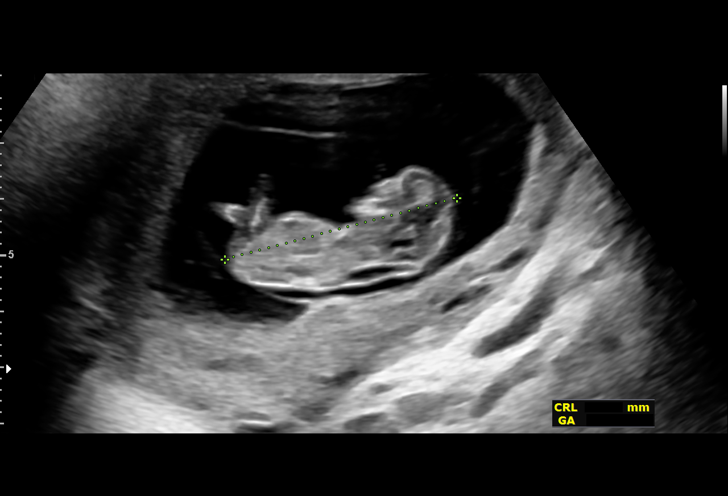
[im 14/24]
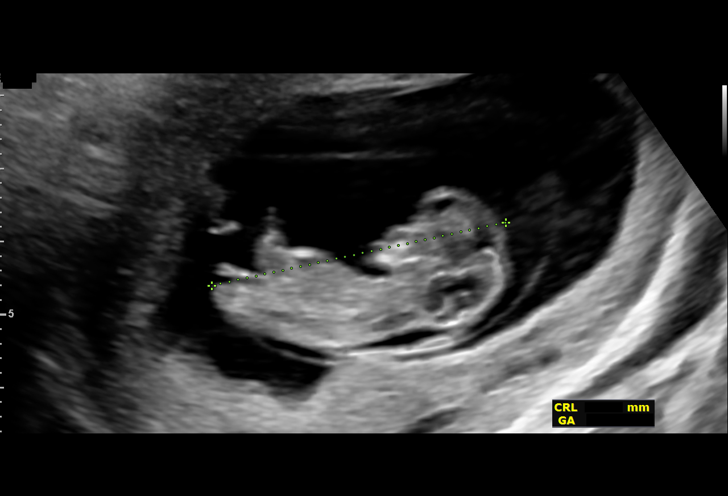
[im 16/24]
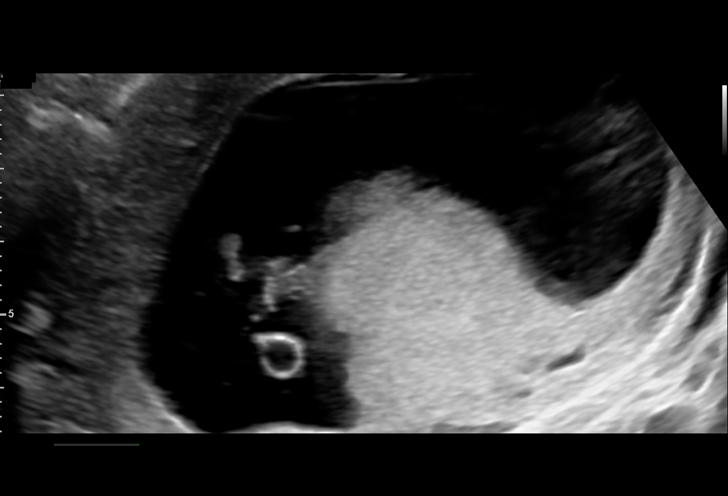
[im 17/24]
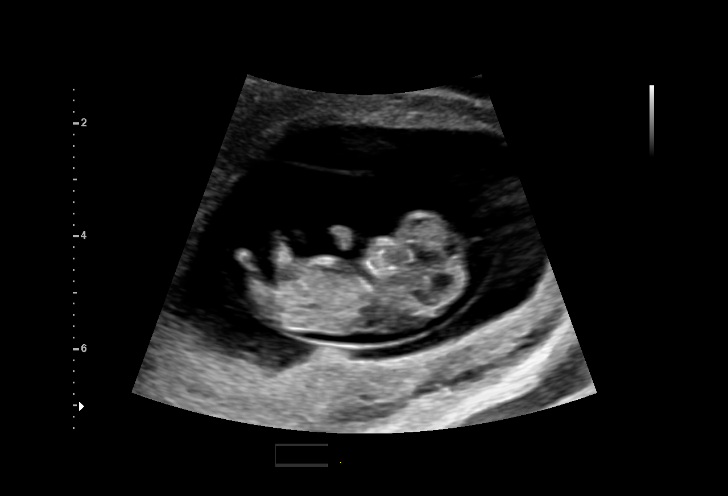
[im 19/24]
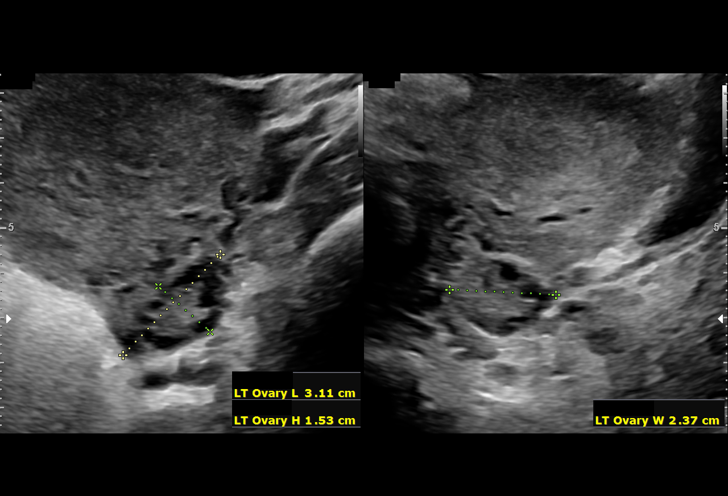
[im 21/24]
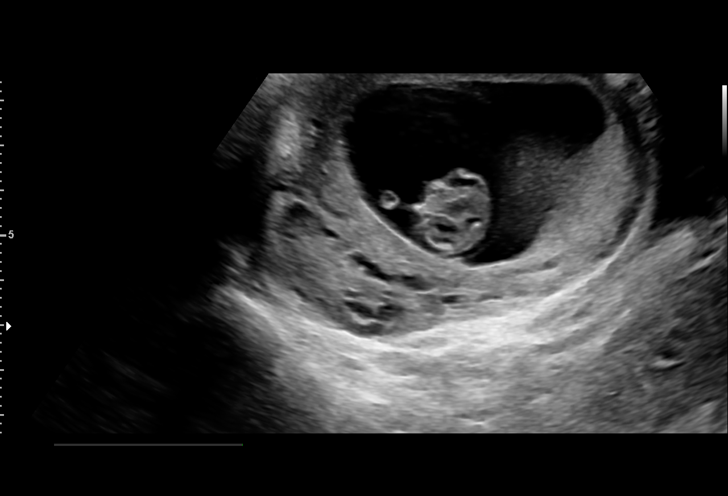
[im 22/24]
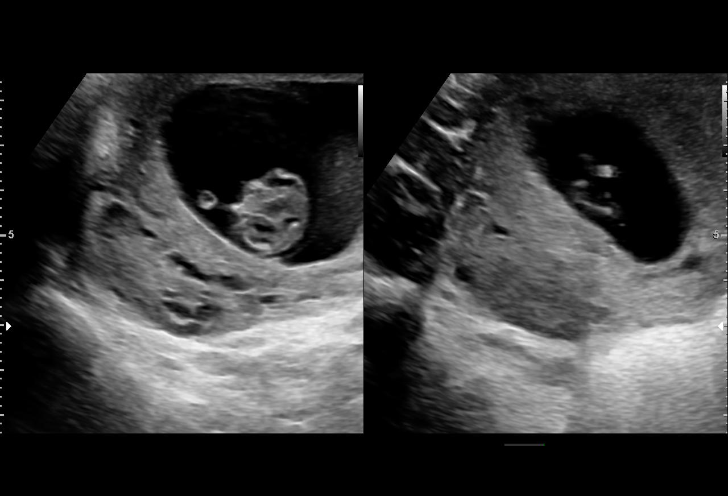
[im 24/24]
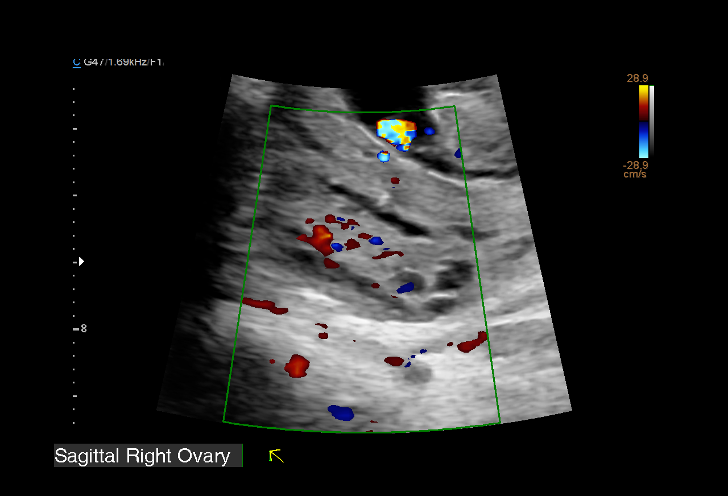

[15 of 24 positions shown; findings below may reference images not displayed]

FINDINGS: Intrauterine gestational sac: Single

Yolk sac:  Present

Embryo:  Present

Cardiac Activity: Present

Heart Rate: 169 bpm

CRL: 41.6 mm   11 w   0 d                  US EDC: 10/15/2020

Subchorionic hemorrhage:  None visualized

Maternal uterus/adnexae: Unremarkable.  No free fluid.
IMPRESSION: Single viable intrauterine pregnancy at 11 weeks 0 days.

## 2021-04-28 ENCOUNTER — Encounter (HOSPITAL_COMMUNITY): Payer: Self-pay | Admitting: Obstetrics & Gynecology

## 2021-04-28 ENCOUNTER — Inpatient Hospital Stay (HOSPITAL_COMMUNITY)
Admission: AD | Admit: 2021-04-28 | Discharge: 2021-04-28 | Disposition: A | Discharge status: Home / Self Care | Payer: Medicaid Other | Attending: Obstetrics & Gynecology | Admitting: Obstetrics & Gynecology

## 2021-04-28 ENCOUNTER — Other Ambulatory Visit: Payer: Self-pay

## 2021-04-28 DIAGNOSIS — R519 Headache, unspecified: Secondary | ICD-10-CM | POA: Insufficient documentation

## 2021-04-28 DIAGNOSIS — Z3A01 Less than 8 weeks gestation of pregnancy: Secondary | ICD-10-CM | POA: Diagnosis not present

## 2021-04-28 DIAGNOSIS — O219 Vomiting of pregnancy, unspecified: Secondary | ICD-10-CM

## 2021-04-28 DIAGNOSIS — O99352 Diseases of the nervous system complicating pregnancy, second trimester: Secondary | ICD-10-CM | POA: Diagnosis not present

## 2021-04-28 DIAGNOSIS — R110 Nausea: Secondary | ICD-10-CM | POA: Insufficient documentation

## 2021-04-28 DIAGNOSIS — O26891 Other specified pregnancy related conditions, first trimester: Secondary | ICD-10-CM | POA: Diagnosis present

## 2021-04-28 DIAGNOSIS — Z3A27 27 weeks gestation of pregnancy: Secondary | ICD-10-CM | POA: Diagnosis not present

## 2021-04-28 LAB — URINALYSIS, ROUTINE W REFLEX MICROSCOPIC
Bilirubin Urine: NEGATIVE
Glucose, UA: NEGATIVE mg/dL
Hgb urine dipstick: NEGATIVE
Ketones, ur: 5 mg/dL — AB
Leukocytes,Ua: NEGATIVE
Nitrite: NEGATIVE
Protein, ur: NEGATIVE mg/dL
Specific Gravity, Urine: 1.028 (ref 1.005–1.030)
pH: 6 (ref 5.0–8.0)

## 2021-04-28 LAB — POCT PREGNANCY, URINE: Preg Test, Ur: POSITIVE — AB

## 2021-04-28 MED ORDER — ONDANSETRON 4 MG PO TBDP: 4.0000 mg | ORAL_TABLET | Freq: Three times a day (TID) | ORAL | 1 refills | Status: AC | PRN

## 2021-04-28 MED ORDER — FAMOTIDINE 20 MG PO TABS: 20.0000 mg | ORAL_TABLET | Freq: Two times a day (BID) | ORAL | 1 refills | Status: AC

## 2021-04-28 NOTE — MAU Provider Note (Signed)
Event Date/Time   First Provider Initiated Contact with Patient 04/28/21 1821     S [name] is a 25 y.o. G2P1001 patient who presents to MAU today with complaint of nausea and headaches. She reports she uses tylenol and that helps but she wants to be sure she isn't taking too much of it. She also reports nausea without vomiting. She is requesting medication for the nausea.  O BP 122/86 (BP Location: Right Arm)   Pulse 79   Temp 98.4 F (36.9 C) (Oral)   Resp 16   Ht 5\' 2"  (1.575 m)   Wt 55.6 kg   LMP 03/24/2021   SpO2 100% Comment: room air  BMI 22.41 kg/m  Physical Exam Vitals and nursing note reviewed.  Constitutional:      General: She is not in acute distress.    Appearance: She is well-developed.  HENT:     Head: Normocephalic.  Cardiovascular:     Rate and Rhythm: Normal rate.  Pulmonary:     Effort: Pulmonary effort is normal. No respiratory distress.  Abdominal:     General: There is no distension.     Tenderness: There is no abdominal tenderness.  Skin:    General: Skin is warm and dry.  Neurological:     Mental Status: She is alert and oriented to person, place, and time.  Psychiatric:        Behavior: Behavior normal.        Thought Content: Thought content normal.        Judgment: Judgment normal.    A Medical screening exam complete 1. Nausea/vomiting in pregnancy   2. [redacted] weeks gestation of pregnancy   3. Headache in pregnancy, antepartum, first trimester    P Discharge from MAU in stable condition RX for zofran and phenergan sent to patient's pharmacy Safe medication in pregnancy list given Warning signs for worsening condition that would warrant emergency follow-up discussed Patient may return to MAU as needed   03/26/2021, Rolm Bookbinder 04/28/2021 6:21 PM

## 2021-04-28 NOTE — MAU Note (Signed)
Amanda Mcdonald is a 25 y.o. at [redacted]w[redacted]d here in MAU reporting: migraines for the past week and nausea. Has tried tylenol and another medication that she cannot remember the name of. The medication works but states she does not want to overdose herself. No abdominal pain or bleeding but does report white discharge. States there is an odor but no itching.   LMP: 03/24/21  Onset of complaint: ongoing  Pain score: 0/10  Vitals:   04/28/21 1818  BP: 122/86  Pulse: 79  Resp: 16  Temp: 98.4 F (36.9 C)  SpO2: 100%     Lab orders placed from triage: UA, UPT

## 2021-04-28 NOTE — MAU Note (Signed)
Pt in the bathroom providing urine sample for UPT

## 2021-04-28 NOTE — Discharge Instructions (Signed)

## 2021-05-17 LAB — OB RESULTS CONSOLE GC/CHLAMYDIA
Chlamydia: NEGATIVE
Gonorrhea: NEGATIVE

## 2021-05-17 LAB — OB RESULTS CONSOLE ABO/RH
"RH Type ": POSITIVE
ABO/RH(D): O POS

## 2021-05-17 LAB — OB RESULTS CONSOLE PLATELET COUNT: Platelets: 223

## 2021-05-17 LAB — OB RESULTS CONSOLE RUBELLA ANTIBODY, IGM: Rubella: IMMUNE

## 2021-05-17 LAB — OB RESULTS CONSOLE ANTIBODY SCREEN: Antibody Screen: NEGATIVE

## 2021-05-17 LAB — OB RESULTS CONSOLE HEPATITIS B SURFACE ANTIGEN: Hepatitis B Surface Ag: NEGATIVE

## 2021-05-17 LAB — HEPATITIS C ANTIBODY: HCV Ab: NEGATIVE

## 2021-05-17 LAB — OB RESULTS CONSOLE RPR: RPR: NONREACTIVE

## 2021-05-17 LAB — OB RESULTS CONSOLE HGB/HCT, BLOOD
HCT: 38 (ref 29–41)
Hemoglobin: 12.8

## 2021-05-17 LAB — HIV ANTIBODY (ROUTINE TESTING W REFLEX): HIV Screen 4th Generation wRfx: NONREACTIVE

## 2021-06-09 ENCOUNTER — Other Ambulatory Visit: Payer: Self-pay

## 2021-06-09 ENCOUNTER — Ambulatory Visit (HOSPITAL_COMMUNITY)
Admission: EM | Admit: 2021-06-09 | Discharge: 2021-06-09 | Disposition: A | Discharge status: Home / Self Care | Payer: Medicaid Other | Attending: Psychiatry | Admitting: Psychiatry

## 2021-06-09 DIAGNOSIS — R4689 Other symptoms and signs involving appearance and behavior: Secondary | ICD-10-CM

## 2021-06-09 DIAGNOSIS — Z630 Problems in relationship with spouse or partner: Secondary | ICD-10-CM | POA: Diagnosis not present

## 2021-06-09 DIAGNOSIS — F6381 Intermittent explosive disorder: Secondary | ICD-10-CM | POA: Diagnosis not present

## 2021-06-09 DIAGNOSIS — F4323 Adjustment disorder with mixed anxiety and depressed mood: Secondary | ICD-10-CM | POA: Insufficient documentation

## 2021-06-09 NOTE — BH Assessment (Signed)
TTS triage: Patient presents under IVC via [employer]. She is calm and cooperative upon arrival. Patient reports she and her boyfriend are having problems and have been arguing frequently. She is currently pregnant. She denies SI/HI/AVH or substance use. She states she would like to be able to speak with some one about her depression and anxiety.  Patient is urgent.

## 2021-06-09 NOTE — Discharge Summary (Signed)
Gilles Chiquito to be D/C'd Home per MD order. Discussed with the patient and all questions fully answered. An After Visit Summary was printed and given to the patient.  Patient escorted out and D/C home via safe transport. Dickie La  06/09/2021 3:21 PM

## 2021-06-09 NOTE — Discharge Instructions (Addendum)
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
## 2021-08-18 ENCOUNTER — Other Ambulatory Visit: Payer: Self-pay

## 2021-08-18 ENCOUNTER — Ambulatory Visit (HOSPITAL_COMMUNITY)
Admission: EM | Admit: 2021-08-18 | Discharge: 2021-08-18 | Disposition: A | Discharge status: Home / Self Care | Payer: Medicaid Other | Attending: Family | Admitting: Family

## 2021-08-18 DIAGNOSIS — F12980 Cannabis use, unspecified with anxiety disorder: Secondary | ICD-10-CM | POA: Insufficient documentation

## 2021-08-18 DIAGNOSIS — O99320 Drug use complicating pregnancy, unspecified trimester: Secondary | ICD-10-CM | POA: Insufficient documentation

## 2021-08-18 DIAGNOSIS — Z640 Problems related to unwanted pregnancy: Secondary | ICD-10-CM | POA: Insufficient documentation

## 2021-08-18 DIAGNOSIS — O99330 Smoking (tobacco) complicating pregnancy, unspecified trimester: Secondary | ICD-10-CM | POA: Insufficient documentation

## 2021-08-18 DIAGNOSIS — Z630 Problems in relationship with spouse or partner: Secondary | ICD-10-CM | POA: Insufficient documentation

## 2021-08-18 DIAGNOSIS — Z3A00 Weeks of gestation of pregnancy not specified: Secondary | ICD-10-CM | POA: Insufficient documentation

## 2021-08-18 DIAGNOSIS — F17210 Nicotine dependence, cigarettes, uncomplicated: Secondary | ICD-10-CM | POA: Insufficient documentation

## 2021-08-18 DIAGNOSIS — F411 Generalized anxiety disorder: Secondary | ICD-10-CM | POA: Insufficient documentation

## 2021-08-18 NOTE — Discharge Instructions (Signed)
Take all medications as prescribed. Keep all follow-up appointments as scheduled.  Do not consume alcohol or use illegal drugs while on prescription medications. Report any adverse effects from your medications to your primary care provider promptly.  In the event of recurrent symptoms or worsening symptoms, call 911, a crisis hotline, or go to the nearest emergency department for evaluation.   

## 2021-08-18 NOTE — Progress Notes (Signed)
Patient discharged by provider.
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
## 2021-09-10 ENCOUNTER — Emergency Department (HOSPITAL_COMMUNITY)
Admission: EM | Admit: 2021-09-10 | Discharge: 2021-09-10 | Discharge status: Against Medical Advice | Payer: Medicaid Other

## 2021-09-10 ENCOUNTER — Other Ambulatory Visit: Payer: Self-pay

## 2021-09-10 NOTE — ED Notes (Signed)
Pt not answering for triage  

## 2021-10-31 ENCOUNTER — Other Ambulatory Visit: Payer: Self-pay

## 2021-10-31 ENCOUNTER — Inpatient Hospital Stay (HOSPITAL_COMMUNITY)
Admission: AD | Admit: 2021-10-31 | Discharge: 2021-10-31 | Disposition: A | Discharge status: Home / Self Care | Payer: Medicaid Other | Attending: Obstetrics and Gynecology | Admitting: Obstetrics and Gynecology

## 2021-10-31 ENCOUNTER — Encounter (HOSPITAL_COMMUNITY): Payer: Self-pay | Admitting: Obstetrics and Gynecology

## 2021-10-31 DIAGNOSIS — Z3A00 Weeks of gestation of pregnancy not specified: Secondary | ICD-10-CM | POA: Insufficient documentation

## 2021-10-31 DIAGNOSIS — O479 False labor, unspecified: Secondary | ICD-10-CM | POA: Diagnosis not present

## 2021-10-31 NOTE — MAU Note (Signed)
Patient arrived to MAU complaining of contractions that started at midnight. Patient stated that the pain is 10/10 Patient reports + FM. And denies vaginal bleeding and leakage of fluid.

## 2021-11-01 LAB — OB RESULTS CONSOLE GBS: GBS: NEGATIVE

## 2021-11-10 ENCOUNTER — Other Ambulatory Visit: Payer: Self-pay

## 2021-11-10 ENCOUNTER — Ambulatory Visit (INDEPENDENT_AMBULATORY_CARE_PROVIDER_SITE_OTHER): Payer: Medicaid Other | Admitting: Obstetrics & Gynecology

## 2021-11-10 VITALS — BP 108/69 | HR 109 | Wt 146.0 lb

## 2021-11-10 DIAGNOSIS — O365930 Maternal care for other known or suspected poor fetal growth, third trimester, not applicable or unspecified: Secondary | ICD-10-CM | POA: Diagnosis not present

## 2021-11-10 DIAGNOSIS — Z3480 Encounter for supervision of other normal pregnancy, unspecified trimester: Secondary | ICD-10-CM | POA: Insufficient documentation

## 2021-11-10 DIAGNOSIS — Z3A37 37 weeks gestation of pregnancy: Secondary | ICD-10-CM | POA: Diagnosis not present

## 2021-11-10 NOTE — Progress Notes (Addendum)
Xfer OB from [facility].

## 2021-11-10 NOTE — Progress Notes (Signed)
  Subjective:transfer for IUGR by [place] in Nov    Amanda Mcdonald is a G2P1001 [redacted]w[redacted]d being seen today for her first obstetrical visit.  Her obstetrical history is significant for intrauterine growth restriction (IUGR). Patient does intend to breast feed. Pregnancy history fully reviewed.  Patient reports no complaints.  Vitals:   11/10/21 1618  BP: 108/69  Pulse: (!) 109  Weight: 146 lb (66.2 kg)    HISTORY: OB History  Gravida Para Term Preterm AB Living  2 1 1     1   SAB IAB Ectopic Multiple Live Births        0 1    # Outcome Date GA Lbr Len/2nd Weight Sex Delivery Anes PTL Lv  2 Current           1 Term 10/13/20 [redacted]w[redacted]d  7 lb 5.1 oz (3.32 kg) F CS-LTranv EPI  LIV     Birth Comments: wnl   Past Medical History:  Diagnosis Date   Medical history non-contributory    Pregnancy induced hypertension    Past Surgical History:  Procedure Laterality Date   CESAREAN SECTION N/A 10/13/2020   Procedure: CESAREAN SECTION;  Surgeon: 13/08/2020, MD;  Location: [facility] LD ORS;  Service: Obstetrics;  Laterality: N/A;   WISDOM TOOTH EXTRACTION     Family History  Problem Relation Age of Onset   Healthy Mother    Healthy Father      Exam    Uterus:     Pelvic Exam: deferred   Perineum:    Vulva:    Vagina:     pH:    Cervix:    Adnexa:    Bony Pelvis:   System: Breast:  normal appearance, no masses or tenderness   Skin: normal coloration and turgor, no rashes    Neurologic: oriented, normal mood   Extremities: normal strength, tone, and muscle mass   HEENT PERRLA   Mouth/Teeth    Neck supple   Cardiovascular: regular rate and rhythm   Respiratory:  appears well, vitals normal, no respiratory distress, acyanotic, normal RR   Abdomen: gravid   Urinary:       Assessment:    Pregnancy: G2P1001 Patient Active Problem List   Diagnosis Date Noted   Supervision of other normal pregnancy, antepartum 11/10/2021   Adjustment disorder with mixed anxiety and  depressed mood    Cesarean delivery delivered 10/13/2020   Non-reassuring fetal heart rate or rhythm affecting management of mother 10/13/2020   Gestational hypertension, third trimester 10/12/2020   Abnormal multiple marker screen in fetus 08/12/2020        Plan:     Initial labs drawn. Prenatal vitamins. Problem list reviewed and updated. Genetic Screening discussed : results reviewed.  Ultrasound discussed; fetal survey: ordered.  Follow up in 1 weeks. 50% of 30 min visit spent on counseling and coordination of care.  Needs MFM 10/12/2020 for growth and f/u to determine delivery plan. TOLAC consent is in record   [place] 11/10/2021

## 2021-11-18 ENCOUNTER — Encounter: Payer: Medicaid Other | Admitting: Obstetrics and Gynecology

## 2021-11-20 ENCOUNTER — Other Ambulatory Visit: Payer: Self-pay

## 2021-11-20 ENCOUNTER — Inpatient Hospital Stay (EMERGENCY_DEPARTMENT_HOSPITAL)
Admission: AD | Admit: 2021-11-20 | Discharge: 2021-11-21 | Disposition: A | Discharge status: Home / Self Care | Payer: Medicaid Other | Source: Home / Self Care | Attending: Obstetrics and Gynecology | Admitting: Obstetrics and Gynecology

## 2021-11-20 DIAGNOSIS — Z3A39 39 weeks gestation of pregnancy: Secondary | ICD-10-CM

## 2021-11-20 DIAGNOSIS — O479 False labor, unspecified: Secondary | ICD-10-CM

## 2021-11-20 LAB — POCT FERN TEST: POCT Fern Test: NEGATIVE

## 2021-11-20 NOTE — MAU Provider Note (Signed)
Event Date/Time  First Provider Initiated Contact with Patient 11/20/21 2223     S: [name] is a 25 y.o. G2P1001 at [redacted]w[redacted]d  who presents to MAU today complaining of leaking of fluid since while in MAU. The leaking started while she was sitting in bed.  She denies vaginal bleeding. She endorses contractions. She reports normal fetal movement.    O: BP 114/75 (BP Location: Right Arm)    Pulse 87    Temp 97.9 F (36.6 C) (Oral)    Resp 16    Ht 5\' 2"  (1.575 m)    Wt 64.4 kg    LMP 02/21/2021    SpO2 100%    BMI 25.97 kg/m  GENERAL: Well-developed, well-nourished female in no acute distress.  HEAD: Normocephalic, atraumatic.  CHEST: Normal effort of breathing, regular heart rate ABDOMEN: Soft, nontender, gravid PELVIC: Normal external female genitalia. Vagina is pink and rugated. Cervix with normal contour, no lesions. Normal discharge.  Negative pooling. + mucoid, thin, discharge noted.   Cervical exam:  Dilation: 2 Effacement (%): 90 Cervical Position: Posterior, Middle Station: -2 Presentation: Vertex Exam by:: [email or web address], RN   Fetal Monitoring: Baseline: 130 bpm Variability: Moderate  Accelerations: 15x15 Decelerations: None Contractions: 7-9 mins apart, irregular pattern.   Results for orders placed or performed during the hospital encounter of 11/20/21 (from the past 24 hour(s))  Fern Test     Status: Normal   Collection Time: 11/20/21 10:53 PM  Result Value Ref Range   POCT Fern Test Negative = intact amniotic membranes      A:  SIUP at [redacted]w[redacted]d  Membranes intact False Labor   P:  Discharge home Patient requests pain medication prior to DC home.  Morphine 4 mg given IM Labor precautions Return to MAU for labor.   [redacted]w[redacted]d I, NP 11/21/2021 4:07 AM

## 2021-11-20 NOTE — MAU Note (Signed)
Pt reports ctx started 1700, every 5-10 minutes, increasing in intensity. Endorses good FM, denies LOF or bleeding. [facility], RN

## 2021-11-21 DIAGNOSIS — Z3A38 38 weeks gestation of pregnancy: Secondary | ICD-10-CM | POA: Insufficient documentation

## 2021-11-21 DIAGNOSIS — Z3A39 39 weeks gestation of pregnancy: Secondary | ICD-10-CM

## 2021-11-21 DIAGNOSIS — O471 False labor at or after 37 completed weeks of gestation: Secondary | ICD-10-CM | POA: Insufficient documentation

## 2021-11-21 DIAGNOSIS — O479 False labor, unspecified: Secondary | ICD-10-CM

## 2021-11-21 MED ORDER — MORPHINE SULFATE (PF) 4 MG/ML IV SOLN
4.0000 mg | Freq: Once | INTRAVENOUS | Status: AC
  Administered 2021-11-21: 01:00:00 4 mg via INTRAMUSCULAR
  Filled 2021-11-21: qty 1

## 2021-11-22 ENCOUNTER — Ambulatory Visit: Payer: Medicaid Other

## 2021-11-22 ENCOUNTER — Inpatient Hospital Stay (HOSPITAL_COMMUNITY): Payer: Medicaid Other | Admitting: Anesthesiology

## 2021-11-22 ENCOUNTER — Encounter: Payer: Medicaid Other | Admitting: Obstetrics and Gynecology

## 2021-11-22 ENCOUNTER — Encounter (HOSPITAL_COMMUNITY): Payer: Self-pay | Admitting: Obstetrics and Gynecology

## 2021-11-22 ENCOUNTER — Inpatient Hospital Stay (HOSPITAL_COMMUNITY)
Admission: AD | Admit: 2021-11-22 | Discharge: 2021-11-24 | DRG: 806 | Disposition: A | Discharge status: Home / Self Care | Payer: Medicaid Other | Attending: Family Medicine | Admitting: Family Medicine

## 2021-11-22 ENCOUNTER — Other Ambulatory Visit: Payer: Self-pay

## 2021-11-22 DIAGNOSIS — O365930 Maternal care for other known or suspected poor fetal growth, third trimester, not applicable or unspecified: Secondary | ICD-10-CM | POA: Diagnosis present

## 2021-11-22 DIAGNOSIS — Z3A39 39 weeks gestation of pregnancy: Secondary | ICD-10-CM

## 2021-11-22 DIAGNOSIS — Z30014 Encounter for initial prescription of intrauterine contraceptive device: Secondary | ICD-10-CM | POA: Diagnosis not present

## 2021-11-22 DIAGNOSIS — O99324 Drug use complicating childbirth: Secondary | ICD-10-CM | POA: Diagnosis not present

## 2021-11-22 DIAGNOSIS — O34211 Maternal care for low transverse scar from previous cesarean delivery: Secondary | ICD-10-CM | POA: Diagnosis not present

## 2021-11-22 DIAGNOSIS — Z3480 Encounter for supervision of other normal pregnancy, unspecified trimester: Secondary | ICD-10-CM

## 2021-11-22 DIAGNOSIS — Z20822 Contact with and (suspected) exposure to covid-19: Secondary | ICD-10-CM | POA: Diagnosis present

## 2021-11-22 DIAGNOSIS — O9912 Other diseases of the blood and blood-forming organs and certain disorders involving the immune mechanism complicating childbirth: Secondary | ICD-10-CM | POA: Diagnosis present

## 2021-11-22 DIAGNOSIS — Z30430 Encounter for insertion of intrauterine contraceptive device: Secondary | ICD-10-CM

## 2021-11-22 DIAGNOSIS — O479 False labor, unspecified: Secondary | ICD-10-CM | POA: Diagnosis not present

## 2021-11-22 DIAGNOSIS — O34219 Maternal care for unspecified type scar from previous cesarean delivery: Secondary | ICD-10-CM | POA: Diagnosis present

## 2021-11-22 DIAGNOSIS — O26893 Other specified pregnancy related conditions, third trimester: Secondary | ICD-10-CM | POA: Diagnosis present

## 2021-11-22 DIAGNOSIS — D696 Thrombocytopenia, unspecified: Secondary | ICD-10-CM | POA: Diagnosis present

## 2021-11-22 LAB — CBC
HCT: 36.9 % (ref 36.0–46.0)
Hemoglobin: 12.5 g/dL (ref 12.0–15.0)
MCH: 30.8 pg (ref 26.0–34.0)
MCHC: 33.9 g/dL (ref 30.0–36.0)
MCV: 90.9 fL (ref 80.0–100.0)
Platelets: 128 10*3/uL — ABNORMAL LOW (ref 150–400)
RBC: 4.06 MIL/uL (ref 3.87–5.11)
RDW: 14.1 % (ref 11.5–15.5)
WBC: 8.6 10*3/uL (ref 4.0–10.5)
nRBC: 0 % (ref 0.0–0.2)

## 2021-11-22 LAB — TYPE AND SCREEN
ABO/RH(D): O POS
Antibody Screen: NEGATIVE

## 2021-11-22 LAB — RESP PANEL BY RT-PCR (FLU A&B, COVID) ARPGX2
Influenza A by PCR: NEGATIVE
Influenza B by PCR: NEGATIVE
SARS Coronavirus 2 by RT PCR: NEGATIVE

## 2021-11-22 LAB — RPR: RPR Ser Ql: NONREACTIVE

## 2021-11-22 MED ORDER — FLEET ENEMA 7-19 GM/118ML RE ENEM: 1.0000 | ENEMA | RECTAL | Status: DC | PRN

## 2021-11-22 MED ORDER — LACTATED RINGERS IV SOLN
500.0000 mL | Freq: Once | INTRAVENOUS | Status: AC
  Administered 2021-11-22: 15:00:00 500 mL via INTRAVENOUS

## 2021-11-22 MED ORDER — LACTATED RINGERS IV SOLN: INTRAVENOUS | Status: DC

## 2021-11-22 MED ORDER — EPHEDRINE 5 MG/ML INJ: 10.0000 mg | INTRAVENOUS | Status: DC | PRN

## 2021-11-22 MED ORDER — ONDANSETRON HCL 4 MG/2ML IJ SOLN: 4.0000 mg | Freq: Four times a day (QID) | INTRAMUSCULAR | Status: DC | PRN

## 2021-11-22 MED ORDER — LIDOCAINE HCL (PF) 1 % IJ SOLN
30.0000 mL | INTRAMUSCULAR | Status: DC | PRN
  Administered 2021-11-23: 09:00:00 30 mL via SUBCUTANEOUS
  Filled 2021-11-22: qty 30

## 2021-11-22 MED ORDER — OXYTOCIN-SODIUM CHLORIDE 30-0.9 UT/500ML-% IV SOLN
1.0000 m[IU]/min | INTRAVENOUS | Status: DC
Start: 2021-11-22 — End: 2021-11-23
  Administered 2021-11-22: 16:00:00 2 m[IU]/min via INTRAVENOUS
  Filled 2021-11-22: qty 500

## 2021-11-22 MED ORDER — OXYTOCIN-SODIUM CHLORIDE 30-0.9 UT/500ML-% IV SOLN: 2.5000 [IU]/h | INTRAVENOUS | Status: DC

## 2021-11-22 MED ORDER — LIDOCAINE-EPINEPHRINE (PF) 2 %-1:200000 IJ SOLN
INTRAMUSCULAR | Status: DC | PRN
  Administered 2021-11-22: 5 mL via EPIDURAL

## 2021-11-22 MED ORDER — OXYCODONE-ACETAMINOPHEN 5-325 MG PO TABS: 1.0000 | ORAL_TABLET | ORAL | Status: DC | PRN

## 2021-11-22 MED ORDER — FENTANYL CITRATE (PF) 100 MCG/2ML IJ SOLN
50.0000 ug | INTRAMUSCULAR | Status: DC | PRN
  Administered 2021-11-22 (×3): 100 ug via INTRAVENOUS
  Filled 2021-11-22 (×3): qty 2

## 2021-11-22 MED ORDER — SOD CITRATE-CITRIC ACID 500-334 MG/5ML PO SOLN: 30.0000 mL | ORAL | Status: DC | PRN

## 2021-11-22 MED ORDER — OXYCODONE-ACETAMINOPHEN 5-325 MG PO TABS: 2.0000 | ORAL_TABLET | ORAL | Status: DC | PRN

## 2021-11-22 MED ORDER — DIPHENHYDRAMINE HCL 50 MG/ML IJ SOLN
12.5000 mg | INTRAMUSCULAR | Status: DC | PRN
  Administered 2021-11-22: 21:00:00 12.5 mg via INTRAVENOUS
  Filled 2021-11-22: qty 1

## 2021-11-22 MED ORDER — LIDOCAINE HCL (PF) 1 % IJ SOLN: 30.0000 mL | INTRAMUSCULAR | Status: DC | PRN

## 2021-11-22 MED ORDER — FENTANYL CITRATE (PF) 100 MCG/2ML IJ SOLN: 100.0000 ug | INTRAMUSCULAR | Status: DC | PRN

## 2021-11-22 MED ORDER — OXYTOCIN-SODIUM CHLORIDE 30-0.9 UT/500ML-% IV SOLN
2.5000 [IU]/h | INTRAVENOUS | Status: DC
  Filled 2021-11-22: qty 500

## 2021-11-22 MED ORDER — ACETAMINOPHEN 325 MG PO TABS: 650.0000 mg | ORAL_TABLET | ORAL | Status: DC | PRN

## 2021-11-22 MED ORDER — FENTANYL-BUPIVACAINE-NACL 0.5-0.125-0.9 MG/250ML-% EP SOLN
EPIDURAL | Status: AC
  Filled 2021-11-22: qty 250

## 2021-11-22 MED ORDER — OXYTOCIN BOLUS FROM INFUSION: 333.0000 mL | Freq: Once | INTRAVENOUS | Status: DC

## 2021-11-22 MED ORDER — LACTATED RINGERS IV SOLN: 500.0000 mL | INTRAVENOUS | Status: DC | PRN

## 2021-11-22 MED ORDER — LEVONORGESTREL 20.1 MCG/DAY IU IUD
1.0000 | INTRAUTERINE_SYSTEM | Freq: Once | INTRAUTERINE | Status: AC
  Administered 2021-11-23: 09:00:00 1 via INTRAUTERINE
  Filled 2021-11-22: qty 1

## 2021-11-22 MED ORDER — PHENYLEPHRINE 40 MCG/ML (10ML) SYRINGE FOR IV PUSH (FOR BLOOD PRESSURE SUPPORT): 80.0000 ug | PREFILLED_SYRINGE | INTRAVENOUS | Status: DC | PRN

## 2021-11-22 MED ORDER — FENTANYL-BUPIVACAINE-NACL 0.5-0.125-0.9 MG/250ML-% EP SOLN
12.0000 mL/h | EPIDURAL | Status: DC | PRN
  Administered 2021-11-22 – 2021-11-23 (×2): 12 mL/h via EPIDURAL
  Filled 2021-11-22: qty 250

## 2021-11-22 MED ORDER — TERBUTALINE SULFATE 1 MG/ML IJ SOLN: 0.2500 mg | Freq: Once | INTRAMUSCULAR | Status: DC | PRN

## 2021-11-22 NOTE — MAU Note (Signed)
Amanda Mcdonald is a 25 y.o. at [redacted]w[redacted]d here in MAU reporting: contractions that "have been all night". Denies SROM , vaginal bleeding or bloody show. Endorses + fetal movement. Pt denies problems or complications with the pregnancy.  Pain score: 10 Vitals:   11/22/21 0508  BP: 123/74  Pulse: 92  Resp: 16  Temp: 97.8 F (36.6 C)  SpO2: 100%      Lab orders placed from triage:  mau labor

## 2021-11-22 NOTE — Progress Notes (Signed)
Labor Progress Note Amanda Mcdonald is a 25 y.o. G2P1001 at [redacted]w[redacted]d presented for spontaneous onset of labor.   S: Patient is resting comfortably. She is open to AROM.  O:  BP 128/70    Pulse 90    Temp 98 F (36.7 C) (Oral)    Resp 16    Ht 5\' 2"  (1.575 m)    Wt 64.5 kg    LMP 02/21/2021    SpO2 99%    BMI 26.01 kg/m  EFM: 145 bpm/moderate variability/+ accels, no decels UC: q2-6 minutes  CVE: Dilation: 5 Effacement (%): 90 Cervical Position: Posterior Station: 0 Presentation: Vertex Exam by:: Alexis [email or web address] RN   A&P: 25 y.o. G2P1001 [redacted]w[redacted]d presented for spontaneous onset of labor.  #Labor: Unchanged, on pitocin of 10. Discussed risks of cord prolapse with AROM, head well applied on SVE, patient consented to AROM with moderate amount of clear fluid. Continue to titrate pitocin. #Pain:  #FWB: Cat I, overall reassuring #GBS negative  #Thrombocytopenia- Plt 128 on admit, continue to monitor. Normotensive.  [redacted]w[redacted]d, MD Center for [facility], [facility] 8:22 PM

## 2021-11-22 NOTE — Anesthesia Procedure Notes (Signed)
Epidural Patient location during procedure: OB Start time: 11/22/2021 3:10 PM End time: 11/22/2021 3:20 PM  Staffing Anesthesiologist: Elmer Picker, MD Performed: anesthesiologist   Preanesthetic Checklist Completed: patient identified, IV checked, risks and benefits discussed, monitors and equipment checked, pre-op evaluation and timeout performed  Epidural Patient position: sitting Prep: DuraPrep and site prepped and draped Patient monitoring: continuous pulse ox, blood pressure, heart rate and cardiac monitor Approach: midline Location: L3-L4 Injection technique: LOR air  Needle:  Needle type: Tuohy  Needle gauge: 17 G Needle length: 9 cm Needle insertion depth: 4.5 cm Catheter type: closed end flexible Catheter size: 19 Gauge Catheter at skin depth: 10 cm Test dose: negative  Assessment Sensory level: T8 Events: blood not aspirated, injection not painful, no injection resistance, no paresthesia and negative IV test  Additional Notes Patient identified. Risks/Benefits/Options discussed with patient including but not limited to bleeding, infection, nerve damage, paralysis, failed block, incomplete pain control, headache, blood pressure changes, nausea, vomiting, reactions to medication both or allergic, itching and postpartum back pain. Confirmed with bedside nurse the patient's most recent platelet count. Confirmed with patient that they are not currently taking any anticoagulation, have any bleeding history or any family history of bleeding disorders. Patient expressed understanding and wished to proceed. All questions were answered. Sterile technique was used throughout the entire procedure. Please see nursing notes for vital signs. Test dose was given through epidural catheter and negative prior to continuing to dose epidural or start infusion. Warning signs of high block given to the patient including shortness of breath, tingling/numbness in hands, complete motor  block, or any concerning symptoms with instructions to call for help. Patient was given instructions on fall risk and not to get out of bed. All questions and concerns addressed with instructions to call with any issues or inadequate analgesia.  Reason for block:procedure for pain

## 2021-11-22 NOTE — Anesthesia Preprocedure Evaluation (Signed)
Anesthesia Evaluation  Patient identified by MRN, date of birth, ID band Patient awake    Reviewed: Allergy & Precautions, NPO status , Patient's Chart, lab work & pertinent test results  Airway Mallampati: II  TM Distance: >3 FB Neck ROM: Full    Dental no notable dental hx. (+) Teeth Intact, Dental Advisory Given   Pulmonary neg pulmonary ROS,    Pulmonary exam normal breath sounds clear to auscultation       Cardiovascular hypertension, negative cardio ROS Normal cardiovascular exam Rhythm:Regular Rate:Normal     Neuro/Psych negative neurological ROS  negative psych ROS   GI/Hepatic negative GI ROS, Neg liver ROS,   Endo/Other  negative endocrine ROS  Renal/GU negative Renal ROS  negative genitourinary   Musculoskeletal negative musculoskeletal ROS (+)   Abdominal   Peds  Hematology negative hematology ROS (+)   Anesthesia Other Findings TOLAC  Reproductive/Obstetrics (+) Pregnancy                             Anesthesia Physical Anesthesia Plan  ASA: 2  Anesthesia Plan: Epidural   Post-op Pain Management:    Induction:   PONV Risk Score and Plan: Treatment may vary due to age or medical condition  Airway Management Planned: Natural Airway  Additional Equipment:   Intra-op Plan:   Post-operative Plan:   Informed Consent: I have reviewed the patients History and Physical, chart, labs and discussed the procedure including the risks, benefits and alternatives for the proposed anesthesia with the patient or authorized representative who has indicated his/her understanding and acceptance.       Plan Discussed with: Anesthesiologist  Anesthesia Plan Comments: (Patient identified. Risks, benefits, options discussed with patient including but not limited to bleeding, infection, nerve damage, paralysis, failed block, incomplete pain control, headache, blood pressure changes,  nausea, vomiting, reactions to medication, itching, and post partum back pain. Confirmed with bedside nurse the patient's most recent platelet count. Confirmed with the patient that they are not taking any anticoagulation, have any bleeding history or any family history of bleeding disorders. Patient expressed understanding and wishes to proceed. All questions were answered. )        Anesthesia Quick Evaluation

## 2021-11-22 NOTE — Progress Notes (Signed)
Amanda Mcdonald is a 25 y.o. G2P1001 at [redacted]w[redacted]d admitted for TOLAC/SOL now being augmented with pitocin  Subjective: Patient reports she feels comfortable. Does not feel any pressure.   Objective: BP 128/70    Pulse 90    Temp 98 F (36.7 C) (Oral)    Resp 16    Ht 5\' 2"  (1.575 m)    Wt 64.5 kg    LMP 02/21/2021    SpO2 99%    BMI 26.01 kg/m  No intake/output data recorded. No intake/output data recorded.  FHT:  FHR: 150 bpm, variability: moderate,  accelerations:  Present,  decelerations:  Present variable decelerations UC:   regular, every 2-3 minutes SVE:   Dilation: 5 Effacement (%): 90 Station: 0 Exam by:: Alexis [email or web address] RN  Labs: Lab Results  Component Value Date   WBC 8.6 11/22/2021   HGB 12.5 11/22/2021   HCT 36.9 11/22/2021   MCV 90.9 11/22/2021   PLT 128 (L) 11/22/2021    Assessment / Plan: G2P1001 at [redacted]w[redacted]d admitted for TOLAC/SOL now being augmented with pitocin  Labor:  continues to be 4-5 cm. IUPC placed during this check for MVU monitoring and uptitration of pitocin as well as in case variable decelerations continue and amnioinfusion becomes needed Fetal Wellbeing:  Category II, moderate variability in between contractions.  Pain Control:  Epidural I/D:   GBS neg   [redacted]w[redacted]d, MD, MPH OB Fellow, Faculty Practice

## 2021-11-22 NOTE — MAU Provider Note (Signed)
Patient in labor. H/o prior c/s due to fetal indications. Discussed TOLAC - form reviewed including rates of uterine rupture, fetal brain injurys, etc. Patient still would like TOLAC. Form signed.  Levie Heritage, DO 11/22/2021 8:38 AM

## 2021-11-22 NOTE — H&P (Addendum)
OBSTETRIC ADMISSION HISTORY AND PHYSICAL  Amanda Mcdonald is a 25 y.o. female G2P1001 with IUP at [redacted]w[redacted]d by LMP presenting for spontaneous onset of labor. She reports +FMs, No LOF, no VB, no blurry vision, headaches or peripheral edema, and RUQ pain.  She plans on breast and bottle feeding. She requests Liletta IUD for birth control. She received her prenatal care at  [facility] with transfer to [facility].    Dating: By LMP --->  Estimated Date of Delivery: 11/28/21  Sono:    @[redacted]w[redacted]d , CWD, normal anatomy, vertex presentation,  2146g, 7.8% EFW   Prenatal History/Complications:  Intrauterine growth restriction- noted on most recent 04-10-1993, let to transfer of care to [facility] Marijuana use in pregnancy - UDS positive 05/17/21 History of gestational HTN in prior pregnancy H/o ASCUS pap HPV negative- needs repeat 03/2023 H/o postpartum depression  Past Medical History: Past Medical History:  Diagnosis Date   Medical history non-contributory    Pregnancy induced hypertension     Past Surgical History: Past Surgical History:  Procedure Laterality Date   CESAREAN SECTION N/A 10/13/2020   Procedure: CESAREAN SECTION;  Surgeon: 13/08/2020, MD;  Location: [facility] LD ORS;  Service: Obstetrics;  Laterality: N/A;   WISDOM TOOTH EXTRACTION      Obstetrical History: OB History     Gravida  2   Para  1   Term  1   Preterm      AB      Living  1      SAB      IAB      Ectopic      Multiple  0   Live Births  1           Social History Social History   Socioeconomic History   Marital status: Single    Spouse name: Catalina Antigua   Number of children: Not on file   Years of education: Not on file   Highest education level: Not on file  Occupational History   Not on file  Tobacco Use   Smoking status: Never   Smokeless tobacco: Never  Vaping Use   Vaping Use: Never used  Substance and Sexual Activity   Alcohol use: No   Drug use: No   Sexual activity: Yes  Other Topics Concern    Not on file  Social History Narrative   Not on file   Social Determinants of Health   Financial Resource Strain: Not on file  Food Insecurity: Not on file  Transportation Needs: Not on file  Physical Activity: Not on file  Stress: Not on file  Social Connections: Not on file    Family History: Family History  Problem Relation Age of Onset   Healthy Mother    Healthy Father     Allergies: No Known Allergies  Pt denies allergies to latex, iodine, or shellfish.  Medications Prior to Admission  Medication Sig Dispense Refill Last Dose   Prenatal Vit-Fe Fumarate-FA (MULTIVITAMIN-PRENATAL) 27-0.8 MG TABS tablet Take 1 tablet by mouth daily at 12 noon.   11/21/2021   famotidine (PEPCID) 20 MG tablet Take 1 tablet (20 mg total) by mouth 2 (two) times daily. 60 tablet 1 Unknown   ferrous sulfate 325 (65 FE) MG tablet TAKE 1 TABLET (325 MG TOTAL) BY MOUTH EVERY OTHER DAY. 90 tablet 3    ondansetron (ZOFRAN ODT) 4 MG disintegrating tablet Take 1 tablet (4 mg total) by mouth every 8 (eight) hours as needed for nausea or vomiting. 30  tablet 1 Unknown     Review of Systems   All systems reviewed and negative except as stated in HPI  Blood pressure 127/60, pulse 78, temperature 97.8 F (36.6 C), temperature source Oral, resp. rate 20, height 5\' 2"  (1.575 m), weight 64.5 kg, last menstrual period 02/21/2021, SpO2 99 %, unknown if currently breastfeeding. General appearance: alert, cooperative, and appears stated age Lungs: normal respiratory effort Abdomen: gravid Extremities: no swelling Presentation: cephalic Fetal monitoringBaseline: 140s bpm, Variability: Good {> 6 bpm), Accelerations: Reactive, and Decelerations: intermittent variable and late decels, not recurrent Uterine activity: q4-8 minutes, patient uncomfortable and breathing through Dilation: 5 Effacement (%): 90 Station: 0 Exam by:: sowder   Prenatal labs: ABO, Rh: --/--/O POS (12/19 0740) Antibody: NEG (12/19  0740) Rubella: Immune (06/13 0000) RPR: NON REACTIVE (12/19 0748)  HBsAg: Negative (06/13 0000)  HIV: Non Reactive (06/13 0000)  GBS: Negative/-- (11/28 0000)  1 hr Glucola: 69, passed early, 81 passed 3rd trimester Genetic screening:  low risk Panorama, quad screen negative for NTD Anatomy 04-28-2001: within normal limits  Prenatal Transfer Tool  Maternal Diabetes: No Genetic Screening: Normal Maternal Ultrasounds/Referrals: IUGR Fetal Ultrasounds or other Referrals:  None Maternal Substance Abuse:  No Significant Maternal Medications:  None Significant Maternal Lab Results: Group B Strep negative  Results for orders placed or performed during the hospital encounter of 11/22/21 (from the past 24 hour(s))  Type and screen [facility]   Collection Time: 11/22/21  7:40 AM  Result Value Ref Range   ABO/RH(D) O POS    Antibody Screen NEG    Sample Expiration      11/25/2021,2359 Performed at [facility], [address] [place], [place]   Resp Panel by RT-PCR (Flu A&B, Covid) Nasopharyngeal Swab   Collection Time: 11/22/21  7:48 AM   Specimen: Nasopharyngeal Swab; Nasopharyngeal(NP) swabs in vial transport medium  Result Value Ref Range   SARS Coronavirus 2 by RT PCR NEGATIVE NEGATIVE   Influenza A by PCR NEGATIVE NEGATIVE   Influenza B by PCR NEGATIVE NEGATIVE  CBC   Collection Time: 11/22/21  7:48 AM  Result Value Ref Range   WBC 8.6 4.0 - 10.5 K/uL   RBC 4.06 3.87 - 5.11 MIL/uL   Hemoglobin 12.5 12.0 - 15.0 g/dL   HCT 11/24/21 70.6 - 23.7 %   MCV 90.9 80.0 - 100.0 fL   MCH 30.8 26.0 - 34.0 pg   MCHC 33.9 30.0 - 36.0 g/dL   RDW 62.8 31.5 - 17.6 %   Platelets 128 (L) 150 - 400 K/uL   nRBC 0.0 0.0 - 0.2 %  RPR   Collection Time: 11/22/21  7:48 AM  Result Value Ref Range   RPR Ser Ql NON REACTIVE NON REACTIVE    Patient Active Problem List   Diagnosis Date Noted   Indication for care in labor and delivery, antepartum 11/22/2021   Normal labor  11/22/2021   Supervision of other normal pregnancy, antepartum 11/10/2021   Adjustment disorder with mixed anxiety and depressed mood    Cesarean delivery delivered 10/13/2020   Non-reassuring fetal heart rate or rhythm affecting management of mother 10/13/2020   Gestational hypertension, third trimester 10/12/2020   Abnormal multiple marker screen in fetus 08/12/2020    Assessment/Plan:  JAMIRACLE AVANTS is a 25 y.o. G2P1001 at [redacted]w[redacted]d here for spontaneous onset of labor, desiring TOLAC.  #Labor/TOLAC: spontaneous onset, continue expectant management. H/o PLTCS x1 for fetal intolerance. Desires TOLAC, consented  with Dr Adrian Blackwater in MAU. Plan starting pitocin after epidural as SVE unchanged since down in MAU. Can consider AROM at next check. #Pain: Epidural when desires #FWB: Cat II, intermittent late and variable decels but moderate variability and overall reassuring, will monitor closely #ID:  GBS negative #MOF: breast/bottle #MOC: post-placental IUD - consented and Liletta ordered. Discussed risk of infection/PID, expulsion, uterine perforation, migration and need for surgical retrieval, small increased risk of ectopic pregnancy but overall decreased risk of pregnancy, and she was in agreement to proceed. #Circ:  N/a female  #IUGR- most recent sono EFW 2146g, 7.8%ile at 35 WGA. Continue expectant management as above.  #Thrombocytopenia- platelets 128 on admission. Normal range blood pressures. Will continue to monitor.  Billey Co, MD  Center for [facility], [facility] 11/22/2021, 3:36 PM

## 2021-11-22 NOTE — MAU Note (Signed)
FH down after contraction ? Maternal tracing.  pt sitting straight up in bed leaning over, had sat up with ctx.  Pt laid back, fetal tracing resumed.  O2sat monitor reapplied to confirm maternal tracing.  Explained to pt.

## 2021-11-23 ENCOUNTER — Encounter (HOSPITAL_COMMUNITY): Payer: Self-pay | Admitting: Obstetrics and Gynecology

## 2021-11-23 ENCOUNTER — Encounter: Payer: Medicaid Other | Admitting: Family Medicine

## 2021-11-23 DIAGNOSIS — O34219 Maternal care for unspecified type scar from previous cesarean delivery: Secondary | ICD-10-CM | POA: Insufficient documentation

## 2021-11-23 DIAGNOSIS — O365930 Maternal care for other known or suspected poor fetal growth, third trimester, not applicable or unspecified: Secondary | ICD-10-CM

## 2021-11-23 DIAGNOSIS — Z3A39 39 weeks gestation of pregnancy: Secondary | ICD-10-CM

## 2021-11-23 DIAGNOSIS — O34211 Maternal care for low transverse scar from previous cesarean delivery: Secondary | ICD-10-CM

## 2021-11-23 DIAGNOSIS — O9912 Other diseases of the blood and blood-forming organs and certain disorders involving the immune mechanism complicating childbirth: Secondary | ICD-10-CM

## 2021-11-23 DIAGNOSIS — Z30014 Encounter for initial prescription of intrauterine contraceptive device: Secondary | ICD-10-CM

## 2021-11-23 DIAGNOSIS — O99324 Drug use complicating childbirth: Secondary | ICD-10-CM

## 2021-11-23 LAB — CBC
HCT: 33.9 % — ABNORMAL LOW (ref 36.0–46.0)
Hemoglobin: 11.4 g/dL — ABNORMAL LOW (ref 12.0–15.0)
MCH: 30.5 pg (ref 26.0–34.0)
MCHC: 33.6 g/dL (ref 30.0–36.0)
MCV: 90.6 fL (ref 80.0–100.0)
Platelets: 107 10*3/uL — ABNORMAL LOW (ref 150–400)
RBC: 3.74 MIL/uL — ABNORMAL LOW (ref 3.87–5.11)
RDW: 14.4 % (ref 11.5–15.5)
WBC: 12.4 10*3/uL — ABNORMAL HIGH (ref 4.0–10.5)
nRBC: 0 % (ref 0.0–0.2)

## 2021-11-23 MED ORDER — COCONUT OIL OIL: 1.0000 "application " | TOPICAL_OIL | Status: DC | PRN

## 2021-11-23 MED ORDER — SENNOSIDES-DOCUSATE SODIUM 8.6-50 MG PO TABS
2.0000 | ORAL_TABLET | ORAL | Status: DC
  Administered 2021-11-23 – 2021-11-24 (×2): 2 via ORAL
  Filled 2021-11-23 (×2): qty 2

## 2021-11-23 MED ORDER — OXYCODONE HCL 5 MG PO TABS
5.0000 mg | ORAL_TABLET | ORAL | Status: DC | PRN
  Administered 2021-11-23: 18:00:00 10 mg via ORAL
  Administered 2021-11-23: 22:00:00 5 mg via ORAL
  Filled 2021-11-23 (×2): qty 2

## 2021-11-23 MED ORDER — IBUPROFEN 600 MG PO TABS
600.0000 mg | ORAL_TABLET | Freq: Four times a day (QID) | ORAL | Status: DC
  Administered 2021-11-23: 11:00:00 600 mg via ORAL
  Filled 2021-11-23: qty 1

## 2021-11-23 MED ORDER — WITCH HAZEL-GLYCERIN EX PADS: 1.0000 "application " | MEDICATED_PAD | CUTANEOUS | Status: DC | PRN

## 2021-11-23 MED ORDER — TETANUS-DIPHTH-ACELL PERTUSSIS 5-2.5-18.5 LF-MCG/0.5 IM SUSY: 0.5000 mL | PREFILLED_SYRINGE | Freq: Once | INTRAMUSCULAR | Status: DC

## 2021-11-23 MED ORDER — DIPHENHYDRAMINE HCL 25 MG PO CAPS: 25.0000 mg | ORAL_CAPSULE | Freq: Four times a day (QID) | ORAL | Status: DC | PRN

## 2021-11-23 MED ORDER — OXYCODONE-ACETAMINOPHEN 5-325 MG PO TABS
2.0000 | ORAL_TABLET | ORAL | Status: DC | PRN
  Administered 2021-11-23: 11:00:00 2 via ORAL
  Filled 2021-11-23: qty 2

## 2021-11-23 MED ORDER — ONDANSETRON HCL 4 MG/2ML IJ SOLN: 4.0000 mg | INTRAMUSCULAR | Status: DC | PRN

## 2021-11-23 MED ORDER — ACETAMINOPHEN 325 MG PO TABS
650.0000 mg | ORAL_TABLET | ORAL | Status: DC | PRN
  Administered 2021-11-23: 11:00:00 650 mg via ORAL
  Filled 2021-11-23: qty 2

## 2021-11-23 MED ORDER — ACETAMINOPHEN 500 MG PO TABS
1000.0000 mg | ORAL_TABLET | Freq: Four times a day (QID) | ORAL | Status: DC
  Administered 2021-11-23 – 2021-11-24 (×3): 1000 mg via ORAL
  Filled 2021-11-23 (×3): qty 2

## 2021-11-23 MED ORDER — PRENATAL MULTIVITAMIN CH
1.0000 | ORAL_TABLET | Freq: Every day | ORAL | Status: DC
  Administered 2021-11-23 – 2021-11-24 (×2): 1 via ORAL
  Filled 2021-11-23 (×2): qty 1

## 2021-11-23 MED ORDER — DIBUCAINE (PERIANAL) 1 % EX OINT: 1.0000 "application " | TOPICAL_OINTMENT | CUTANEOUS | Status: DC | PRN

## 2021-11-23 MED ORDER — ONDANSETRON HCL 4 MG PO TABS: 4.0000 mg | ORAL_TABLET | ORAL | Status: DC | PRN

## 2021-11-23 MED ORDER — SIMETHICONE 80 MG PO CHEW: 80.0000 mg | CHEWABLE_TABLET | ORAL | Status: DC | PRN

## 2021-11-23 MED ORDER — IBUPROFEN 800 MG PO TABS
800.0000 mg | ORAL_TABLET | Freq: Three times a day (TID) | ORAL | Status: DC
  Administered 2021-11-23 – 2021-11-24 (×3): 800 mg via ORAL
  Filled 2021-11-23 (×3): qty 1

## 2021-11-23 MED ORDER — ZOLPIDEM TARTRATE 5 MG PO TABS: 5.0000 mg | ORAL_TABLET | Freq: Every evening | ORAL | Status: DC | PRN

## 2021-11-23 MED ORDER — BENZOCAINE-MENTHOL 20-0.5 % EX AERO: 1.0000 "application " | INHALATION_SPRAY | CUTANEOUS | Status: DC | PRN

## 2021-11-23 NOTE — Anesthesia Postprocedure Evaluation (Signed)
Anesthesia Post Note  Patient: Amanda Mcdonald  Procedure(s) Performed: AN AD HOC LABOR EPIDURAL     Patient location during evaluation: Mother Baby Anesthesia Type: Epidural Level of consciousness: awake and alert Pain management: pain level controlled Vital Signs Assessment: post-procedure vital signs reviewed and stable Respiratory status: spontaneous breathing, nonlabored ventilation and respiratory function stable Cardiovascular status: stable Postop Assessment: no headache, no backache and epidural receding Anesthetic complications: no   No notable events documented.  Last Vitals:  Vitals:   11/23/21 1155 11/23/21 1219  BP: (!) 151/104 113/78  Pulse: 77 70  Resp:    Temp: 37.1 C   SpO2: 100%     Last Pain:  Vitals:   11/23/21 1355  TempSrc:   PainSc: 9    Pain Goal:                   Caela Huot

## 2021-11-23 NOTE — Progress Notes (Signed)
Amanda Mcdonald is a 25 y.o. G2P1001 at [redacted]w[redacted]d admitted for TOLAC/SOL now being augmented. Also with IUGR (7.8%ile)  Subjective: Patient reports feeling rectal pressure and like she needs to have bowel movement Objective: BP 126/72    Pulse 92    Temp 98.4 F (36.9 C) (Oral)    Resp 16    Ht 5\' 2"  (1.575 m)    Wt 64.5 kg    LMP 02/21/2021    SpO2 99%    BMI 26.01 kg/m  No intake/output data recorded. No intake/output data recorded.  FHT:  FHR: 140 bpm, variability: moderate,  accelerations:  Present,  decelerations:  Absent UC:   regular, every 2-3 minutes SVE:   Dilation: 6 Effacement (%): 80 Station: 0 Exam by:: Dr. [email or web address]  Labs: Lab Results  Component Value Date   WBC 8.6 11/22/2021   HGB 12.5 11/22/2021   HCT 36.9 11/22/2021   MCV 90.9 11/22/2021   PLT 128 (L) 11/22/2021    Assessment / Plan: Cervix changed from 4 to 6 cm. Station also progressed to 0 station. IUPC was dislodged during repositioning so was replaced during recheck.  - continue to uptitrate pitocin as tolerated since not adequate contractions yet.   Fetal Wellbeing:  Category I Pain Control:  Epidural   11/24/2021, MD, MPH OB Fellow, Faculty Practice

## 2021-11-23 NOTE — Discharge Summary (Signed)
Postpartum Discharge Summary     Patient Name: Amanda Mcdonald DOB: Apr 10, 1996 MRN: [id number]  Date of admission: 11/22/2021 Delivery date:11/23/2021  Delivering provider: Renard Matter  Date of discharge: 11/24/2021  Admitting diagnosis: Indication for care in labor and delivery, antepartum [O75.9] Normal labor [O80, Z37.9] Intrauterine pregnancy: [redacted]w[redacted]d    Secondary diagnosis:  Principal Problem:   Indication for care in labor and delivery, antepartum Active Problems:   Supervision of other normal pregnancy, antepartum   Normal labor   VBAC, delivered, current hospitalization  Additional problems:None    Discharge diagnosis: Term Pregnancy Delivered and VBAC and thrombocytopenia                                       Post partum procedures: PP liletta IUD placed Augmentation: AROM and Pitocin Complications: None  Hospital course: Onset of Labor With Vaginal Delivery      25y.o. yo G2P1001 at 337w2das admitted in Latent Labor on 11/22/2021. Patient had an uncomplicated labor course as follows:  Membrane Rupture Time/Date: 7:47 PM ,11/22/2021   Delivery Method:VBAC, Spontaneous  Episiotomy: None  Lacerations:  Periurethral  Patient had an uncomplicated postpartum course.  She is ambulating, tolerating a regular diet, passing flatus, and urinating well. Patient is discharged home in stable condition on 11/24/21.  Newborn Data: Birth date:11/23/2021  Birth time:8:17 AM  Gender:Female  Living status:Living  Apgars:9 ,9  Weight:2700 g   Magnesium Sulfate received: No BMZ received: No Rhophylac:N/A MMR:N/A T-DaP:Given prenatally Flu: Given prenatally Transfusion:No  Physical exam  Vitals:   11/23/21 1543 11/23/21 1959 11/23/21 2356 11/24/21 0554  BP: 127/71 115/67 110/80 103/69  Pulse: 77 86 78 72  Resp:  _0 Temp: 97.7 F (36.5 C) 99.2 F (37.3 C) 98 F (36.7 C) 98.8 F (37.1 C)  TempSrc:  Oral Oral Oral  SpO2: 100% 100% 100% 100%  Weight:       Height:       General: alert, cooperative, and no distress Lochia: appropriate Uterine Fundus: firm Incision: N/A DVT Evaluation: No evidence of DVT seen on physical exam. Labs: Lab Results  Component Value Date   WBC 11.4 (H) 11/24/2021   HGB 8.8 (L) 11/24/2021   HCT 25.7 (L) 11/24/2021   MCV 91.1 11/24/2021   PLT 109 (L) 11/24/2021   CMP Latest Ref Rng & Units 10/14/2020  Glucose 70 - 99 mg/dL -  BUN 6 - 20 mg/dL -  Creatinine 0.44 - 1.00 mg/dL 1.04(H)  Sodium 135 - 145 mmol/L -  Potassium 3.5 - 5.1 mmol/L -  Chloride 98 - 111 mmol/L -  CO2 22 - 32 mmol/L -  Calcium 8.9 - 10.3 mg/dL -  Total Protein 6.5 - 8.1 g/dL -  Total Bilirubin 0.3 - 1.2 mg/dL -  Alkaline Phos 38 - 126 U/L -  AST 15 - 41 U/L -  ALT 0 - 44 U/L -   Edinburgh Score: Edinburgh Postnatal Depression Scale Screening Tool 10/14/2020  I have been able to laugh and see the funny side of things. 0  I have looked forward with enjoyment to things. 0  I have blamed myself unnecessarily when things went wrong. 0  I have been anxious or worried for no good reason. 0  I have felt scared or panicky for no good reason. 0  Things have been getting on top of me.  0  I have been so unhappy that I have had difficulty sleeping. 0  I have felt sad or miserable. 0  I have been so unhappy that I have been crying. 0  The thought of harming myself has occurred to me. 0  Edinburgh Postnatal Depression Scale Total 0     After visit meds:  Allergies as of 11/24/2021   No Known Allergies      Medication List     TAKE these medications    famotidine 20 MG tablet Commonly known as: PEPCID Take 1 tablet (20 mg total) by mouth 2 (two) times daily.   FeroSul 325 (65 FE) MG tablet Generic drug: ferrous sulfate TAKE 1 TABLET (325 MG TOTAL) BY MOUTH EVERY OTHER DAY.   ibuprofen 800 MG tablet Commonly known as: ADVIL Take 1 tablet (800 mg total) by mouth every 8 (eight) hours.   multivitamin-prenatal 27-0.8 MG  Tabs tablet Take 1 tablet by mouth daily at 12 noon.   ondansetron 4 MG disintegrating tablet Commonly known as: Zofran ODT Take 1 tablet (4 mg total) by mouth every 8 (eight) hours as needed for nausea or vomiting.         Discharge home in stable condition Infant Feeding: Breast Infant Disposition:home with mother Discharge instruction: per After Visit Summary and Postpartum booklet. Activity: Advance as tolerated. Pelvic rest for 6 weeks.  Diet: routine diet Future Appointments: Future Appointments  Date Time Provider [place]  12/23/2021 10:15 AM Shelly Bombard, MD [place] None   Follow up Visit:  [place]. Schedule an appointment as soon as possible for a visit in 4 week(s).   Specialty: Obstetrics and Gynecology Contact information: [address], [place] [address] [place] [phone]               Message sent to [facility] by Dr. Cy Blamer on 11/23/2021  Please schedule this patient for a In person postpartum visit in 4 weeks with the following provider: Any provider. Additional Postpartum F/U: None   Low risk pregnancy complicated by:  TOLAC, gestational thrombocytopenia Delivery mode:  VBAC, Spontaneous  Anticipated Birth Control:  PP IUD placed   11/24/2021 Hansel Feinstein, CNM

## 2021-11-23 NOTE — Progress Notes (Signed)
Pt continues to refuse position changes at this time and wishes to remain semi-fowlers. Pt educated on importance of position changes during labor.

## 2021-11-24 LAB — CBC
HCT: 25.7 % — ABNORMAL LOW (ref 36.0–46.0)
Hemoglobin: 8.8 g/dL — ABNORMAL LOW (ref 12.0–15.0)
MCH: 31.2 pg (ref 26.0–34.0)
MCHC: 34.2 g/dL (ref 30.0–36.0)
MCV: 91.1 fL (ref 80.0–100.0)
Platelets: 109 10*3/uL — ABNORMAL LOW (ref 150–400)
RBC: 2.82 MIL/uL — ABNORMAL LOW (ref 3.87–5.11)
RDW: 14.5 % (ref 11.5–15.5)
WBC: 11.4 10*3/uL — ABNORMAL HIGH (ref 4.0–10.5)
nRBC: 0 % (ref 0.0–0.2)

## 2021-11-24 MED ORDER — IBUPROFEN 800 MG PO TABS: 800.0000 mg | ORAL_TABLET | Freq: Three times a day (TID) | ORAL | 0 refills | Status: AC
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
## 2021-12-04 ENCOUNTER — Telehealth (HOSPITAL_COMMUNITY): Payer: Self-pay

## 2021-12-04 NOTE — Telephone Encounter (Signed)
"  My bottom is still hurting some. I take the ibuprofen, it helps. My bleeding, the flow can be heavy at night time. During the daytime the flow is fine." RN asks patient if she is saturating more than 1 pad in an hour, passing clots bigger than an egg, or having any large gushes? "Sometimes the flow is just heavy at night and I change the pad every hour." RN told patient to reach out to her OB GYN about her bleeding. RN told patient that is she saturates more than one pad an hour, passes large clots bigger than an egg, or is having large gushes to get help from her OB GYN. Patient has no other concerns or questions about her healing.  "She's fine. I'm doing a combination or breastfeeding and bottle feeding. She is doing well with breast and bottle feeding. She's been using the bathroom regularly. I do notice sometimes she has some constipation. She is taking some formula." RN explained that sometimes formula can cause some constipation. RN told patient to reach out to her pediatrician if she concerned that baby is having trouble with constipation. "She sleeps in a bassinet." RN reviewed ABC's of safe sleep with patient. Patient declines any questions or concerns about baby.  EPDS score is 8.   Marcelino Duster [facility] 12/04/2021,1349

## 2021-12-21 ENCOUNTER — Ambulatory Visit (HOSPITAL_COMMUNITY)
Admission: EM | Admit: 2021-12-21 | Discharge: 2021-12-21 | Disposition: A | Discharge status: Home / Self Care | Payer: Medicaid Other | Attending: Registered Nurse | Admitting: Registered Nurse

## 2021-12-21 DIAGNOSIS — Z638 Other specified problems related to primary support group: Secondary | ICD-10-CM

## 2021-12-21 DIAGNOSIS — F4323 Adjustment disorder with mixed anxiety and depressed mood: Secondary | ICD-10-CM | POA: Diagnosis present
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
## 2021-12-23 ENCOUNTER — Ambulatory Visit: Payer: Medicaid Other | Admitting: Obstetrics

## 2022-01-05 ENCOUNTER — Telehealth (HOSPITAL_COMMUNITY): Payer: Self-pay

## 2022-01-05 NOTE — BH Assessment (Signed)
Care Management - Follow Up [facility] Discharges   Writer made contact with the patient. Patient reports that she will follow up appointment with [facility] at [facility] on the 2nd floor (Monday through Friday).  Writer informed the patient that the [facility] was first come first serve.

## 2022-12-02 ENCOUNTER — Other Ambulatory Visit: Payer: Self-pay

## 2022-12-02 ENCOUNTER — Emergency Department (HOSPITAL_COMMUNITY)
Admission: EM | Admit: 2022-12-02 | Discharge: 2022-12-02 | Disposition: A | Discharge status: Home / Self Care | Payer: Medicaid Other | Attending: Student | Admitting: Student

## 2022-12-02 DIAGNOSIS — R112 Nausea with vomiting, unspecified: Secondary | ICD-10-CM | POA: Insufficient documentation

## 2022-12-02 DIAGNOSIS — Z3200 Encounter for pregnancy test, result unknown: Secondary | ICD-10-CM

## 2022-12-02 DIAGNOSIS — Z3202 Encounter for pregnancy test, result negative: Secondary | ICD-10-CM | POA: Insufficient documentation

## 2022-12-02 LAB — PREGNANCY, URINE: Preg Test, Ur: NEGATIVE

## 2022-12-02 NOTE — Discharge Instructions (Signed)
Your urine pregnancy was negative today, is possible that this is too soon, you can always recheck it next couple days may also follow-up with your primary doctor for further assessment.  Come back to the emergency department if you develop chest pain, shortness of breath, severe abdominal pain, uncontrolled nausea, vomiting, diarrhea.

## 2022-12-02 NOTE — ED Triage Notes (Signed)
Pt arrived concerned for pregnancy, pt having nausea and vomiting.  Last menstrual period Nov 28  Pt took her own IUD out a couple of months ago, no vaginal bleeding or discharge

## 2022-12-02 NOTE — ED Provider Notes (Signed)
[facility] EMERGENCY DEPARTMENT Provider Note   CSN: [id number] Arrival date & time: 12/02/22  0135     History  Chief Complaint  Patient presents with   Nausea    Amanda Mcdonald is a 26 y.o. female.  HPI   Without significant mount of medical history presents with complaints of requesting a urine pregnancy.  Patient states that over the last week she has been having nausea vomiting and some general body aches, states this feels if she is pregnant, she is having no stomach pains, no pelvic pain no vaginal discharge no vaginal bleeding, she states that she is not on birth control, last menstrual cycle was a month ago, states that she is regular. she has no history ovarian torsion ovarian cyst ectopic pregnancies.  She has no other complaints.  She states that she is here because she just wants a urine pregnancy.    Home Medications Prior to Admission medications   Medication Sig Start Date End Date Taking? Authorizing Provider  famotidine (PEPCID) 20 MG tablet Take 1 tablet (20 mg total) by mouth 2 (two) times daily. 04/28/21   Rolm Bookbinder, CNM  ferrous sulfate 325 (65 FE) MG tablet TAKE 1 TABLET (325 MG TOTAL) BY MOUTH EVERY OTHER DAY. 10/15/20 10/15/21  Gita Kudo, MD  ibuprofen (ADVIL) 800 MG tablet Take 1 tablet (800 mg total) by mouth every 8 (eight) hours. 11/24/21   Aviva Signs, CNM  ondansetron (ZOFRAN ODT) 4 MG disintegrating tablet Take 1 tablet (4 mg total) by mouth every 8 (eight) hours as needed for nausea or vomiting. 04/28/21   Rolm Bookbinder, CNM  Prenatal Vit-Fe Fumarate-FA (MULTIVITAMIN-PRENATAL) 27-0.8 MG TABS tablet Take 1 tablet by mouth daily at 12 noon.    [provider]      Allergies    Patient has no known allergies.    Review of Systems   Review of Systems  Constitutional:  Negative for chills and fever.  Respiratory:  Negative for shortness of breath.   Cardiovascular:  Negative for chest pain.   Gastrointestinal:  Negative for abdominal pain.  Neurological:  Negative for headaches.    Physical Exam Updated Vital Signs BP 124/88   Pulse 69   Temp 98.3 F (36.8 C) (Oral)   Resp 16   Ht 5\' 2"  (1.575 m)   Wt 59 kg   LMP 11/01/2022   SpO2 100%   BMI 23.78 kg/m  Physical Exam Vitals and nursing note reviewed.  Constitutional:      General: She is not in acute distress.    Appearance: She is not ill-appearing.  HENT:     Head: Normocephalic and atraumatic.     Nose: No congestion.  Eyes:     Conjunctiva/sclera: Conjunctivae normal.  Cardiovascular:     Rate and Rhythm: Normal rate and regular rhythm.     Pulses: Normal pulses.     Heart sounds: No murmur heard.    No friction rub. No gallop.  Pulmonary:     Effort: No respiratory distress.     Breath sounds: No wheezing, rhonchi or rales.  Abdominal:     Palpations: Abdomen is soft.     Tenderness: There is no abdominal tenderness. There is no right CVA tenderness or left CVA tenderness.  Skin:    General: Skin is warm and dry.  Neurological:     Mental Status: She is alert.  Psychiatric:        Mood and  Affect: Mood normal.     ED Results / Procedures / Treatments   Labs (all labs ordered are listed, but only abnormal results are displayed) Labs Reviewed  PREGNANCY, URINE    EKG None  Radiology No results found.  Procedures Procedures    Medications Ordered in ED Medications - No data to display  ED Course/ Medical Decision Making/ A&P                           Medical Decision Making Amount and/or Complexity of Data Reviewed Labs: ordered.   This patient presents to the ED for concern of possible pregnancy, this involves an extensive number of treatment options, and is a complaint that carries with it a high risk of complications and morbidity.  The differential diagnosis includes ectopic disease, ovarian torsion, ovarian cyst,    Additional history obtained:  Additional history  obtained from N/A External records from outside source obtained and reviewed including OB/GYN notes   Co morbidities that complicate the patient evaluation  N/A  Social Determinants of Health:  N/A    Lab Tests:  I Ordered, and personally interpreted labs.  The pertinent results include: Urine pregnancy is negative   Imaging Studies ordered:  I ordered imaging studies including N/A I independently visualized and interpreted imaging which showed N/A I agree with the radiologist interpretation   Cardiac Monitoring:  The patient was maintained on a cardiac monitor.  I personally viewed and interpreted the cardiac monitored which showed an underlying rhythm of: N/A   Medicines ordered and prescription drug management:  I ordered medication including N/A I have reviewed the patients home medicines and have made adjustments as needed  Critical Interventions:  N/A   Reevaluation:  Urine pregnancy is negative, patient agreement discharge at this time  Consultations Obtained:  N/A    Test Considered:  N/A    Rule out Suspicion for ectopic pregnancy is low as urine pregnancy is negative.  I doubt ovarian torsion ovarian cyst that she has no pelvic or suprapubic pain.  I doubt UTI [place] or kidney stone she does not Dors any urinary symptoms she has no flank tenderness no CVA tenderness.  I doubt bowel obstruction as abdomen soft nondistended still passing gas having normal bowel movements.    Dispostion and problem list  After consideration of the diagnostic results and the patients response to treatment, I feel that the patent would benefit from discharge.  Requesting a pregnancy-patient may follow-up with her PCP as needed            Final Clinical Impression(s) / ED Diagnoses Final diagnoses:  Encounter for confirmation of pregnancy test result with physical examination    Rx / DC Orders ED Discharge Orders     None         Carroll Sage, PA-C 12/02/22 4580    Maia Plan, MD 12/02/22 [phone]

## 2022-12-26 ENCOUNTER — Other Ambulatory Visit: Payer: Self-pay | Admitting: Nurse Practitioner

## 2022-12-26 DIAGNOSIS — R102 Pelvic and perineal pain: Secondary | ICD-10-CM

## 2023-01-11 ENCOUNTER — Other Ambulatory Visit: Payer: Medicaid Other

## 2023-08-03 ENCOUNTER — Ambulatory Visit (HOSPITAL_COMMUNITY)
Admission: EM | Admit: 2023-08-03 | Discharge: 2023-08-03 | Disposition: A | Discharge status: Home / Self Care | Payer: Medicaid Other | Attending: Psychiatry | Admitting: Psychiatry

## 2023-08-03 DIAGNOSIS — F4321 Adjustment disorder with depressed mood: Secondary | ICD-10-CM | POA: Insufficient documentation

## 2023-08-03 DIAGNOSIS — Z653 Problems related to other legal circumstances: Secondary | ICD-10-CM | POA: Insufficient documentation

## 2023-08-03 NOTE — Discharge Instructions (Addendum)
Discharge recommendations:   Outpatient Follow up: Please review list of outpatient resources for psychiatry and counseling. Please follow up with your primary care provider for all medical related needs.   You are encouraged to follow up with [facility] for outpatient treatment.  Walk in/ Open Access Hours: Monday - Friday 8AM - 11AM (To see provider and therapist) Friday - 1PM - 4PM (To see therapist only)  [facility] [address] [place], [place] [phone]  Therapy: We recommend that patient participate in individual therapy to address mental health concerns.  Safety:   The following safety precautions should be taken:   No sharp objects. This includes scissors, razors, scrapers, and putty knives.   Chemicals should be removed and locked up.   Medications should be removed and locked up.   Weapons should be removed and locked up. This includes firearms, knives and instruments that can be used to cause injury.   The patient should abstain from use of illicit substances/drugs and abuse of any medications.  If symptoms worsen or do not continue to improve or if the patient becomes actively suicidal or homicidal then it is recommended that the patient return to the closest hospital emergency department, the [facility], or call 911 for further evaluation and treatment. National Suicide Prevention Lifeline 1-800-SUICIDE or 1-800-273-8255.  About 988 988 offers 24/7 access to trained crisis counselors who can help people experiencing mental health-related distress. People can call or text 988 or chat 988lifeline.org for themselves or if they are worried about a loved one who may need crisis support.     
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
## 2023-09-15 ENCOUNTER — Ambulatory Visit (HOSPITAL_COMMUNITY): Payer: Medicaid Other | Admitting: Licensed Clinical Social Worker

## 2023-09-15 DIAGNOSIS — F4325 Adjustment disorder with mixed disturbance of emotions and conduct: Secondary | ICD-10-CM
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
## 2023-09-21 NOTE — Addendum Note (Signed)
Encounter addended by: Morrell Riddle, RN on: 09/21/2023 8:31 AM  Actions taken: Letter saved

## 2023-10-20 ENCOUNTER — Encounter (HOSPITAL_COMMUNITY): Payer: Self-pay

## 2023-10-20 ENCOUNTER — Ambulatory Visit (HOSPITAL_COMMUNITY): Payer: Medicaid Other | Admitting: Licensed Clinical Social Worker

## 2023-11-06 ENCOUNTER — Ambulatory Visit (INDEPENDENT_AMBULATORY_CARE_PROVIDER_SITE_OTHER): Payer: Medicaid Other | Admitting: Licensed Clinical Social Worker

## 2023-11-06 DIAGNOSIS — F4323 Adjustment disorder with mixed anxiety and depressed mood: Secondary | ICD-10-CM | POA: Diagnosis not present
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
## 2023-12-25 ENCOUNTER — Encounter (HOSPITAL_COMMUNITY): Payer: Self-pay

## 2023-12-25 ENCOUNTER — Ambulatory Visit (HOSPITAL_COMMUNITY): Payer: Medicaid Other | Admitting: Licensed Clinical Social Worker
